# Patient Record
Sex: Male | Born: 1985
Health system: Southern US, Community
[De-identification: ages and names within clinical notes are randomized; demographics above are authoritative.]

## PROBLEM LIST (undated history)

## (undated) DIAGNOSIS — J45909 Unspecified asthma, uncomplicated: Secondary | ICD-10-CM

## (undated) HISTORY — DX: Unspecified asthma, uncomplicated: J45.909

---

## 2009-05-18 ENCOUNTER — Encounter: Admission: RE | Admit: 2009-05-18 | Discharge: 2009-05-18 | Payer: Self-pay | Admitting: Infectious Diseases

## 2011-06-09 ENCOUNTER — Inpatient Hospital Stay (INDEPENDENT_AMBULATORY_CARE_PROVIDER_SITE_OTHER)
Admission: RE | Admit: 2011-06-09 | Discharge: 2011-06-09 | Disposition: A | Discharge status: Home / Self Care | Payer: Self-pay | Source: Ambulatory Visit | Attending: Family Medicine | Admitting: Family Medicine

## 2011-06-09 DIAGNOSIS — B078 Other viral warts: Secondary | ICD-10-CM

## 2014-06-29 ENCOUNTER — Emergency Department (INDEPENDENT_AMBULATORY_CARE_PROVIDER_SITE_OTHER)
Admission: EM | Admit: 2014-06-29 | Discharge: 2014-06-29 | Disposition: A | Discharge status: Home / Self Care | Payer: 59 | Source: Home / Self Care | Attending: Family Medicine | Admitting: Family Medicine

## 2014-06-29 ENCOUNTER — Encounter (HOSPITAL_COMMUNITY): Payer: Self-pay | Admitting: Emergency Medicine

## 2014-06-29 DIAGNOSIS — J342 Deviated nasal septum: Secondary | ICD-10-CM

## 2014-06-29 DIAGNOSIS — J309 Allergic rhinitis, unspecified: Secondary | ICD-10-CM

## 2014-06-29 MED ORDER — FLUTICASONE PROPIONATE 50 MCG/ACT NA SUSP: 2.0000 | Freq: Every day | NASAL | Status: DC

## 2014-06-29 NOTE — Discharge Instructions (Signed)
Your symptoms are likely a result of a swollen nasal passage from allergies which is made worse by a slightly deviated septum on the left. Please start using nasal saline 2-4 times a day to clean everything out. Please also start using the flonase at night. This is a nasal steroid to help with the inflammation and swelling Please also consider using ibuprofen 400-600mg  from time to time if needed Please consider getting in to see a regular doctor for possible ENT evaluation.    Cc tri?u ch?ng c?a b?n c th? s? l k?t qu? c?a m?t ?o?n m?i s?ng d? ?ng m c th? t? h?n b?i m?t vch ng?n h?i l?ch v? bn tri. Hy b?t ??u b?ng cch s? d?ng n??c mu?i vo m?i 2-4 l?n m?t ngy ?? lm s?ch t?t c? m?i th? ra ngoi. Xin c?ng b?t ??u s? d?ng cc Flonase vo ban ?m. ?y l m?t steroid m?i ?? gip ?? v?i tnh tr?ng vim v s?ng Xin vui lng xem xt s? d?ng 400-600mg  ibuprofen theo th?i gian n?u c?n thi?t Vui lng xem xt nh?n ???c vo g?p bc s? th??ng xuyn ?? ?nh gi ENT c th?.Marland Kitchen

## 2014-06-29 NOTE — ED Provider Notes (Signed)
CSN: [id number]     Arrival date & time 06/29/14  [id number] History   None    Chief Complaint  Patient presents with  . Facial Pain    nasal pain   (Consider location/radiation/quality/duration/timing/severity/associated sxs/prior Treatment) HPI  Nasal sensitivity: constant. Worse [facility] cold [facility]10 in severity. starrted 2 years ago. Getting worse. Both. Denies any fractures. OTC allergy medications w/o relief. No radiation. Occasionally associated w/ sneezing, and runny nose. Denies HA, bloody nose.    History reviewed. No pertinent past medical history. History reviewed. No pertinent past surgical history. History reviewed. No pertinent family history. History  Substance Use Topics  . Smoking status: Never Smoker   . Smokeless tobacco: Not on file  . Alcohol Use: Yes    Review of Systems Per HPI with all other pertinent systems negative.   Allergies  Review of patient's allergies indicates no known allergies.  Home Medications   Prior to Admission medications   Medication Sig Start Date End Date Taking? Authorizing Provider  fluticasone (FLONASE) 50 MCG/ACT nasal spray Place 2 sprays into both nostrils daily. 06/29/14   Ozella Rocksavid J Adanya Sosinski, MD   BP 134/91  Pulse 68  Temp(Src) 97.9 F (36.6 C) (Oral)  Resp 16  SpO2 98% Physical Exam  Constitutional: He appears well-developed and well-nourished. No distress.  HENT:  Head: Normocephalic and atraumatic.  Mouth/Throat: No oropharyngeal exudate.  Nasal turbinates boggy bilat. L nasal septum deviated slightly   Eyes: EOM are normal. Pupils are equal, round, and reactive to light.  Neck: Normal range of motion.  Cardiovascular: Normal rate, normal heart sounds and intact distal pulses.  Exam reveals no gallop.   No murmur heard. Pulmonary/Chest: Effort normal and breath sounds normal. No respiratory distress. He has no wheezes. He has no rales. He exhibits no tenderness.  Abdominal: Soft. He  exhibits no distension.  Musculoskeletal: Normal range of motion. He exhibits no edema and no tenderness.  Lymphadenopathy:    He has no cervical adenopathy.  Neurological: He is alert. He exhibits normal muscle tone.  Skin: Skin is warm. No rash noted. He is not diaphoretic.  Psychiatric: He has a normal mood and affect. His behavior is normal. Judgment and thought content normal.    ED Course  Procedures (including critical care time) Labs Review Labs Reviewed - No data to display  Imaging Review No results found.   MDM   1. Deviated septum   2. Allergic rhinitis, unspecified allergic rhinitis type    Deivated septum and allergic rhinitis are the most likely cause of pts symptoms.  Start nasal saline and flonase Consider ENT f/u for possible surgical correction.  Precautions given and all questions answered   Shelly Flattenavid Negar Sieler, MD Family Medicine 06/29/2014, 9:15 AM      Ozella Rocksavid J Retina Bernardy, MD 06/29/14 [phone]

## 2014-06-29 NOTE — ED Notes (Signed)
C/o nasal pain over the past two years but has gradually gotten worse.  States has more pain with in cold conditions.  Some nose bleeds and stuffiness.  No relief with otc allergy meds.

## 2016-01-04 ENCOUNTER — Emergency Department (HOSPITAL_COMMUNITY): Payer: BLUE CROSS/BLUE SHIELD

## 2016-01-04 ENCOUNTER — Encounter (HOSPITAL_COMMUNITY): Payer: Self-pay | Admitting: *Deleted

## 2016-01-04 ENCOUNTER — Emergency Department (HOSPITAL_COMMUNITY)
Admission: EM | Admit: 2016-01-04 | Discharge: 2016-01-04 | Disposition: A | Discharge status: Home / Self Care | Payer: BLUE CROSS/BLUE SHIELD | Attending: Emergency Medicine | Admitting: Emergency Medicine

## 2016-01-04 DIAGNOSIS — J40 Bronchitis, not specified as acute or chronic: Secondary | ICD-10-CM | POA: Diagnosis not present

## 2016-01-04 DIAGNOSIS — Z7951 Long term (current) use of inhaled steroids: Secondary | ICD-10-CM | POA: Diagnosis not present

## 2016-01-04 DIAGNOSIS — R05 Cough: Secondary | ICD-10-CM | POA: Diagnosis present

## 2016-01-04 MED ORDER — IPRATROPIUM-ALBUTEROL 0.5-2.5 (3) MG/3ML IN SOLN
3.0000 mL | Freq: Once | RESPIRATORY_TRACT | Status: AC
  Administered 2016-01-04: 3 mL via RESPIRATORY_TRACT
  Filled 2016-01-04: qty 3

## 2016-01-04 MED ORDER — PREDNISONE 20 MG PO TABS
40.0000 mg | ORAL_TABLET | Freq: Every day | ORAL | Status: DC
Start: 2016-01-04 — End: 2016-02-07

## 2016-01-04 MED ORDER — GUAIFENESIN-DM 100-10 MG/5ML PO SYRP
5.0000 mL | ORAL_SOLUTION | ORAL | Status: DC | PRN
Start: 2016-01-04 — End: 2017-02-19

## 2016-01-04 MED ORDER — IPRATROPIUM BROMIDE 0.02 % IN SOLN
0.5000 mg | Freq: Once | RESPIRATORY_TRACT | Status: AC
  Administered 2016-01-04: 0.5 mg via RESPIRATORY_TRACT
  Filled 2016-01-04: qty 2.5

## 2016-01-04 MED ORDER — ALBUTEROL SULFATE HFA 108 (90 BASE) MCG/ACT IN AERS
2.0000 | INHALATION_SPRAY | Freq: Once | RESPIRATORY_TRACT | Status: AC
  Administered 2016-01-04: 2 via RESPIRATORY_TRACT
  Filled 2016-01-04: qty 6.7

## 2016-01-04 MED ORDER — PREDNISONE 20 MG PO TABS
60.0000 mg | ORAL_TABLET | Freq: Once | ORAL | Status: AC
  Administered 2016-01-04: 60 mg via ORAL
  Filled 2016-01-04: qty 3

## 2016-01-04 MED ORDER — ALBUTEROL SULFATE (2.5 MG/3ML) 0.083% IN NEBU
5.0000 mg | INHALATION_SOLUTION | Freq: Once | RESPIRATORY_TRACT | Status: AC
  Administered 2016-01-04: 5 mg via RESPIRATORY_TRACT
  Filled 2016-01-04: qty 6

## 2016-01-04 NOTE — Discharge Instructions (Signed)
History inhaler, 2 puffs every 4 hours. Take prednisone as prescribed until all gone. Take Robitussin as needed for cough. Rest. Tylenol or Motrin for pain and headache. Follow-up as needed. Return if worsening symptoms. Stop smoking  Acute Bronchitis Bronchitis is inflammation of the airways that extend from the windpipe into the lungs (bronchi). The inflammation often causes mucus to develop. This leads to a cough, which is the most common symptom of bronchitis.  In acute bronchitis, the condition usually develops suddenly and goes away over time, usually in a couple weeks. Smoking, allergies, and asthma can make bronchitis worse. Repeated episodes of bronchitis may cause further lung problems.  CAUSES Acute bronchitis is most often caused by the same virus that causes a cold. The virus can spread from person to person (contagious) through coughing, sneezing, and touching contaminated objects. SIGNS AND SYMPTOMS   Cough.   Fever.   Coughing up mucus.   Body aches.   Chest congestion.   Chills.   Shortness of breath.   Sore throat.  DIAGNOSIS  Acute bronchitis is usually diagnosed through a physical exam. Your health care provider will also ask you questions about your medical history. Tests, such as chest X-rays, are sometimes done to rule out other conditions.  TREATMENT  Acute bronchitis usually goes away in a couple weeks. Oftentimes, no medical treatment is necessary. Medicines are sometimes given for relief of fever or cough. Antibiotic medicines are usually not needed but may be prescribed in certain situations. In some cases, an inhaler may be recommended to help reduce shortness of breath and control the cough. A cool mist vaporizer may also be used to help thin bronchial secretions and make it easier to clear the chest.  HOME CARE INSTRUCTIONS  Get plenty of rest.   Drink enough fluids to keep your urine clear or pale yellow (unless you have a medical condition  that requires fluid restriction). Increasing fluids may help thin your respiratory secretions (sputum) and reduce chest congestion, and it will prevent dehydration.   Take medicines only as directed by your health care provider.  If you were prescribed an antibiotic medicine, finish it all even if you start to feel better.  Avoid smoking and secondhand smoke. Exposure to cigarette smoke or irritating chemicals will make bronchitis worse. If you are a smoker, consider using nicotine gum or skin patches to help control withdrawal symptoms. Quitting smoking will help your lungs heal faster.   Reduce the chances of another bout of acute bronchitis by washing your hands frequently, avoiding people with cold symptoms, and trying not to touch your hands to your mouth, nose, or eyes.   Keep all follow-up visits as directed by your health care provider.  SEEK MEDICAL CARE IF: Your symptoms do not improve after 1 week of treatment.  SEEK IMMEDIATE MEDICAL CARE IF:  You develop an increased fever or chills.   You have chest pain.   You have severe shortness of breath.  You have bloody sputum.   You develop dehydration.  You faint or repeatedly feel like you are going to pass out.  You develop repeated vomiting.  You develop a severe headache. MAKE SURE YOU:   Understand these instructions.  Will watch your condition.  Will get help right away if you are not doing well or get worse.   This information is not intended to replace advice given to you by your health care provider. Make sure you discuss any questions you have with your health care  provider.   Document Released: 01/01/2005 Document Revised: 12/15/2014 Document Reviewed: 05/17/2013 Elsevier Interactive Patient Education [employer].

## 2016-01-04 NOTE — ED Provider Notes (Signed)
CSN: [id number]     Arrival date & time 01/04/16  1931 History  By signing my name below, I, Timothy Adkins, attest that this documentation has been prepared under the direction and in the presence of Ramello Cordial, PA-C. Electronically Signed: Placido Adkins, ED Scribe. 01/04/2016. 9:21 PM.    Chief Complaint  Patient presents with  . Cough   The history is provided by the patient and a friend. No language interpreter was used.    HPI Comments: Timothy Adkins is a 30 y.o. male who presents to the Emergency Department complaining of worsening, moderate, productive cough with onset 4 days ago. He friend states he's experienced associated back pain, SOB, wheezing and HA. Pt was taking OTC cold medication which he ran out of earlier today with his symptoms then worsening with his friend noting that he called him this afternoon due to his worsening SOB. He denies a hx of asthma but confirms a hx of smoking. His vaccinations are UTD. He denies any other associated symptoms at this time.    History reviewed. No pertinent past medical history. History reviewed. No pertinent past surgical history. No family history on file. Social History  Substance Use Topics  . Smoking status: Never Smoker   . Smokeless tobacco: None  . Alcohol Use: Yes    Review of Systems  Respiratory: Positive for cough, shortness of breath and wheezing.   Musculoskeletal: Positive for back pain.  Neurological: Positive for headaches.    Allergies  Review of patient's allergies indicates no known allergies.  Home Medications   Prior to Admission medications   Medication Sig Start Date End Date Taking? Authorizing Provider  fluticasone (FLONASE) 50 MCG/ACT nasal spray Place 2 sprays into both nostrils daily. 06/29/14   Ozella Rocks, MD   BP 119/82 mmHg  Pulse 82  Temp(Src) 98.8 F (37.1 C)  Resp 20  Wt 142 lb (64.411 kg)  SpO2 98%    Physical Exam  Constitutional: He is oriented to person, place,  and time. He appears well-developed and well-nourished.  HENT:  Head: Normocephalic and atraumatic.  Eyes: EOM are normal.  Neck: Normal range of motion.  Cardiovascular: Normal rate, regular rhythm and normal heart sounds.   Pulmonary/Chest: Effort normal. No respiratory distress. He has wheezes. He has rales.  Expiratory wheezes and rales at bases  Abdominal: Soft.  Musculoskeletal: Normal range of motion.  Neurological: He is alert and oriented to person, place, and time.  Skin: Skin is warm and dry.  Psychiatric: He has a normal mood and affect.  Nursing note and vitals reviewed.   ED Course  Procedures  DIAGNOSTIC STUDIES: Oxygen Saturation is 98% on RA, normal by my interpretation.    COORDINATION OF CARE: 9:20 PM Discussed next steps with pt and his friend. They understood and are agreeable to the plan.   Labs Review Labs Reviewed - No data to display  Imaging Review Dg Chest 2 View  01/04/2016  CLINICAL DATA:  Initial evaluation for acute chest pain with cough and congestion. Fever. Smoker. EXAM: CHEST  2 VIEW COMPARISON:  Prior radiograph from 05/18/2009. FINDINGS: Cardiac and mediastinal silhouettes are stable in size and contour, and remain within normal limits. Lungs are mildly hypoinflated. No pulmonary edema or pleural effusion. There is diffuse peribronchial thickening, suggesting bronchiolitis. Finding may in part be related to history of smoking. No pneumothorax. No acute osseus abnormality. IMPRESSION: Diffuse peribronchial thickening, suggesting acute bronchiolitis given the history of cough, congestion and fever. No  consolidative opacity to suggest pneumonia. Electronically Signed   By: Rise Mu M.D.   On: 01/04/2016 21:05   I have personally reviewed and evaluated these images as part of my medical decision-making.   EKG Interpretation None      MDM   Final diagnoses:  Bronchitis   Pt with cough for the last 3 days, wheezing, rales heard at  bases bilaterally. X-ray obtained which is consistent bronchiolitis. Patient is a smoker. Patient received 2 breathing treatments which he states helped. Also start on prednisone, discharged home with inhaler, will try Robitussin DM for cough. Follow-up with a family doctor. This time vital signs are normal, patient is not hypoxic, afebrile. No concerning findings on chest x-ray.  Filed Vitals:   01/04/16 1949  BP: 119/82  Pulse: 82  Temp: 98.8 F (37.1 C)  Resp: 20  Weight: 64.411 kg  SpO2: 98%   I personally performed the services described in this documentation, which was scribed in my presence. The recorded information has been reviewed and is accurate.   Jaynie Crumble, PA-C 01/04/16 2202  Richardean Canal, MD 01/04/16 [phone]

## 2016-01-04 NOTE — ED Notes (Signed)
Cough cold with headache back pain and some diff breathing.  Productive cough  Think watery  Audible wheezes

## 2016-02-07 ENCOUNTER — Ambulatory Visit (INDEPENDENT_AMBULATORY_CARE_PROVIDER_SITE_OTHER): Payer: BLUE CROSS/BLUE SHIELD | Admitting: Family Medicine

## 2016-02-07 VITALS — BP 110/80 | HR 90 | Temp 98.2°F | Resp 18 | Ht 63.0 in | Wt 143.8 lb

## 2016-02-07 DIAGNOSIS — J4521 Mild intermittent asthma with (acute) exacerbation: Secondary | ICD-10-CM

## 2016-02-07 MED ORDER — ALBUTEROL SULFATE HFA 108 (90 BASE) MCG/ACT IN AERS: 2.0000 | INHALATION_SPRAY | RESPIRATORY_TRACT | Status: DC | PRN

## 2016-02-07 MED ORDER — PREDNISONE 20 MG PO TABS: ORAL_TABLET | ORAL | Status: DC

## 2016-02-07 MED ORDER — ALBUTEROL SULFATE (2.5 MG/3ML) 0.083% IN NEBU
2.5000 mg | INHALATION_SOLUTION | Freq: Once | RESPIRATORY_TRACT | Status: AC
  Administered 2016-02-07: 2.5 mg via RESPIRATORY_TRACT

## 2016-02-07 MED ORDER — BECLOMETHASONE DIPROPIONATE 40 MCG/ACT IN AERS: 2.0000 | INHALATION_SPRAY | Freq: Two times a day (BID) | RESPIRATORY_TRACT | Status: DC

## 2016-02-07 NOTE — Patient Instructions (Signed)
You have asthma which is probably caused by allergies. We have an early spring and that is causing a lot of pollen in the air. You will probably have problems with this off and on until summertime. I also expect that you may have problems every year in the spring time.  Use the albuterol (Proventil) inhaler 2 inhalations every 4-6 hours, a proximally 4 times daily. This should be used as I instructed you in the office. This is not to be used long-term if you're not needing it. Keep some of the medicine on hand for any time that you do develop wheezing and coughing.  Use the Qvar inhaler also, 40 g,, 2 inhalations twice daily. If your breathing has been doing well for 3 or 4 weeks you can then decrease to using one inhalation twice daily. This medicine should be used long-term to help prevent asthma attacks. If you're doing well by about July you can discontinue this and see how you do.  If you're not doing better on these 2 medicines please return because there are some other medications we can try.  Asthma, Adult Asthma is a recurring condition in which the airways tighten and narrow. Asthma can make it difficult to breathe. It can cause coughing, wheezing, and shortness of breath. Asthma episodes, also called asthma attacks, range from minor to life-threatening. Asthma cannot be cured, but medicines and lifestyle changes can help control it. CAUSES Asthma is believed to be caused by inherited (genetic) and environmental factors, but its exact cause is unknown. Asthma may be triggered by allergens, lung infections, or irritants in the air. Asthma triggers are different for each person. Common triggers include:   Animal dander.  Dust mites.  Cockroaches.  Pollen from trees or grass.  Mold.  Smoke.  Air pollutants such as dust, household cleaners, hair sprays, aerosol sprays, paint fumes, strong chemicals, or strong odors.  Cold air, weather changes, and winds (which increase molds and  pollens in the air).  Strong emotional expressions such as crying or laughing hard.  Stress.  Certain medicines (such as aspirin) or types of drugs (such as beta-blockers).  Sulfites in foods and drinks. Foods and drinks that may contain sulfites include dried fruit, potato chips, and sparkling grape juice.  Infections or inflammatory conditions such as the flu, a cold, or an inflammation of the nasal membranes (rhinitis).  Gastroesophageal reflux disease (GERD).  Exercise or strenuous activity. SYMPTOMS Symptoms may occur immediately after asthma is triggered or many hours later. Symptoms include:  Wheezing.  Excessive nighttime or early morning coughing.  Frequent or severe coughing with a common cold.  Chest tightness.  Shortness of breath. DIAGNOSIS  The diagnosis of asthma is made by a review of your medical history and a physical exam. Tests may also be performed. These may include:  Lung function studies. These tests show how much air you breathe in and out.  Allergy tests.  Imaging tests such as X-rays. TREATMENT  Asthma cannot be cured, but it can usually be controlled. Treatment involves identifying and avoiding your asthma triggers. It also involves medicines. There are 2 classes of medicine used for asthma treatment:   Controller medicines. These prevent asthma symptoms from occurring. They are usually taken every day.  Reliever or rescue medicines. These quickly relieve asthma symptoms. They are used as needed and provide short-term relief. Your health care provider will help you create an asthma action plan. An asthma action plan is a written plan for managing and treating  your asthma attacks. It includes a list of your asthma triggers and how they may be avoided. It also includes information on when medicines should be taken and when their dosage should be changed. An action plan may also involve the use of a device called a peak flow meter. A peak flow meter  measures how well the lungs are working. It helps you monitor your condition. HOME CARE INSTRUCTIONS   Take medicines only as directed by your health care provider. Speak with your health care provider if you have questions about how or when to take the medicines.  Use a peak flow meter as directed by your health care provider. Record and keep track of readings.  Understand and use the action plan to help minimize or stop an asthma attack without needing to seek medical care.  Control your home environment in the following ways to help prevent asthma attacks:  Do not smoke. Avoid being exposed to secondhand smoke.  Change your heating and air conditioning filter regularly.  Limit your use of fireplaces and wood stoves.  Get rid of pests (such as roaches and mice) and their droppings.  Throw away plants if you see mold on them.  Clean your floors and dust regularly. Use unscented cleaning products.  Try to have someone else vacuum for you regularly. Stay out of rooms while they are being vacuumed and for a short while afterward. If you vacuum, use a dust mask from a hardware store, a double-layered or microfilter vacuum cleaner bag, or a vacuum cleaner with a HEPA filter.  Replace carpet with wood, tile, or vinyl flooring. Carpet can trap dander and dust.  Use allergy-proof pillows, mattress covers, and box spring covers.  Wash bed sheets and blankets every week in hot water and dry them in a dryer.  Use blankets that are made of polyester or cotton.  Clean bathrooms and kitchens with bleach. If possible, have someone repaint the walls in these rooms with mold-resistant paint. Keep out of the rooms that are being cleaned and painted.  Wash hands frequently. SEEK MEDICAL CARE IF:   You have wheezing, shortness of breath, or a cough even if taking medicine to prevent attacks.  The colored mucus you cough up (sputum) is thicker than usual.  Your sputum changes from clear or  white to yellow, green, gray, or bloody.  You have any problems that may be related to the medicines you are taking (such as a rash, itching, swelling, or trouble breathing).  You are using a reliever medicine more than 2-3 times per week.  Your peak flow is still at 50-79% of your personal best after following your action plan for 1 hour.  You have a fever. SEEK IMMEDIATE MEDICAL CARE IF:   You seem to be getting worse and are unresponsive to treatment during an asthma attack.  You are short of breath even at rest.  You get short of breath when doing very little physical activity.  You have difficulty eating, drinking, or talking due to asthma symptoms.  You develop chest pain.  You develop a fast heartbeat.  You have a bluish color to your lips or fingernails.  You are light-headed, dizzy, or faint.  Your peak flow is less than 50% of your personal best.   This information is not intended to replace advice given to you by your health care provider. Make sure you discuss any questions you have with your health care provider.   Document Released: 11/24/2005 Document Revised:  08/15/2015 Document Reviewed: 06/23/2013 Elsevier Interactive Patient Education 2016 [employer].  Hen suy?n, Ng??i l?n (Asthma, Adult) Hen suy?n l m?t tnh tr?ng b?nh l ti pht trong ?, ???ng h h?p b? th?t ch?t v h?p l?i. Hen suy?n c th? gy kh th?. N c th? gy ho, th? kh kh, v kh th? Cc ??t hen, hay cn g?i l cc c?n hen, c th? ? m?c ?? t? nh? ??n nguy hi?m ??n tnh m?ng. Khng th? ch?a kh?i ???c hen suy?n, nh?ng thu?c v thay ??i l?i s?ng c th? gip ki?m sot b?nh ny. NGUYN NHN Hen ???c cho l do cc y?u t? ???c th?a h??ng t? th? h? tr??c (di truy?n) v mi tr??ng, nh?ng nguyn nhn chnh xc v?n ch?a ???c xc ??nh. Hen c th? b? kh?i pht b?i cc d? ?ng nguyn, nhi?m trng ph?i, ho?c cc ch?t kch thch trong khng kh. Tc nhn gy hen suy?n khc nhau v?i m?i ng??i. Tc nhn ph?  bi?n gy kh?i pht b?nh bao g?m:   V?y da ??ng v?t.  M?t b?i nh.  Gin.  Ph?n hoa c?a cy ho?c c?.  N?m m?c.  Khi thu?c.  Cc ch?t  nhi?m khng kh nh? b?i, ch?t t?y r?a ?? gia d?ng, ch?t x?t tc, ch?t phun x?t, h?i s?n, ha ch?t m?nh ho?c mi m?nh.  Khng kh l?nh, thay ??i th?i ti?t, v gi (lm t?ng l??ng n?m m?c v ph?n hoa trong khng kh).  Bi?u l? c?m xc m?nh nh? khc ho?c c??i nhi?u.  C?ng th?ng.  M?t s? lo?i thu?c nh?t ??nh (nh? aspirin) ho?c cc lo?i thu?c (nh? thu?c ch?n b ta).  Ch?t sunfit trong th?c ?n v ?? u?ng. Th?c ?n v ?? u?ng c th? c ch?t sunfit, bao g?m tri cy kh, khoai ty chin, v n??c nho c ga.  Cc tnh tr?ng nhi?m trng ho?c vim nh? cm, c?m l?nh ho?c vim nim m?c m?i (vim m?i ).  B?nh tro ng??c d? dy th?c qu?n (GERD).  T?p luy?n ho?c ho?t ??ng c?ng th?ng. TRI?U CH?NG Cc tri?u ch?ng c th? di?n ra ngay sau khi hen suy?n kh?i pht ho?c nhi?u gi? sau ?. Cc tri?u ch?ng bao g?m:  Th? kh kh.  Ho nhi?u vo ban ?m ho?c bu?i sng s?m.  Ho th??ng xuyn ho?c ho d? d?i khi b? c?m l?nh thng th??ng.  T?c ng?c.  Kh th?. CH?N ?ON  Ch?n ?on hen suy?n ???c th?c hi?n b?ng cch xem xt b?nh s? c?a qu v? v khm th?c th?. C?ng c th? ph?i lm cc ph?n ki?m tra. Nh?ng ki?m tra ny c th? bao g?m:  Nghin c?u ch?ng n?ng ph?i. Ki?m tra ny cho bi?t qu v? ht vo v th? ra ???c bao nhiu khng kh.  Ki?m tra d? ?ng.  Ki?m tra b?ng hnh ?nh nh? ch?p X quang. ?I?U TR?  Hen suy?n khng th? ch?a kh?i ???c, nh?ng th??ng c th? ki?m sot ???c. ?i?u tr? bao g?m vi?c xc ??nh v phng trnh cc tc nhn gy kh?i pht hen suy?n cho qu v?. ?i?u tr? c?ng c th? bao g?m c? thu?c. C 2 lo?i thu?c ???c s? d?ng ?? ?i?u tr? hen suy?n:   Cc thu?c ki?m sot. Cc thu?c ny ng?n ng?a khng cho cc tri?u ch?ng hen suy?n x?y ra. Thu?c th??ng ???c dng m?i ngy.  Thu?c lm gi?m nh? ho?c c?p c?u. Nh?ng thu?c ny nhanh chng lm gi?m cc tri?u ch?ng hen  suy?n. Cc thu?c ? th??ng ???  c dng khi c?n thi?t v ?? gi?m nh? tri?u ch?ng trong th?i gian ng?n. Chuyn gia ch?m [place] s?c kh?e s? gip qu v? xy d?ng m?t k? ho?ch th?c hi?n vi?c ki?m sot hen suy?n. M?t k? ho?ch th?c hi?n vi?c ki?m sot hen suy?n l m?t k? ho?ch ???c ??a ra ?? qu?n l v ?i?u tr? cc c?n hen suy?n c?a qu v?. N bao g?m m?t danh sch cc tc nhn gy kh?i pht hen suy?n v cch c th? phng trnh [place]. K? ho?ch ? c?ng bao g?m thng tin v? vi?c khi no c?n dng thu?c v khi no c?n thay ??i li?u thu?c. M?t k? ho?ch th?c hi?n c?ng c th? bao g?m vi?c s? d?ng m?t d?ng c? g?i l l?u l??ng ??nh k?. L?u l??ng ??nh k? ?o kh? n?ng ho?t ??ng c?a ph?i. N gip gim st tnh tr?ng c?a qu v?. H??NG D?N CH?M [place] T?I NH   Ch? s? d?ng thu?c theo ch? d?n c?a chuyn gia ch?m [place] s?c kh?e. Hy ni v?i chuyn gia ch?m [place] s?c kh?e n?u qu v? c cc cu h?i v? cch th?c v th?i gian dng thu?c.  S? d?ng l?u l??ng ??nh k? theo ch? d?n c?a chuyn gia ch?m [place] s?c kh?e. Ghi l?i v theo di cc k?t qu? ?o.  Hi?u v s? d?ng k? ho?ch th?c hi?n ?? gip gi?m thi?u ho?c ng?n ch?n c?n hen m khng c?n ph?i ?i khm.  Ki?m sot mi tr??ng trong nh c?a qu v? theo nh?ng cch sau ?? gip ng?n ch?n c?n hen:  Khng ht thu?c. [place] b? ti?p xc v?i khi thu?c th? ??ng.  Thay b? l?c l s??i v ?i?u ho khng kh th??ng xuyn.  H?n ch? s? d?ng l s??i v b?p c?i.  Di?t v?t gy h?i (ch?ng h?n nh? gin v chu?t) v lo?i b? phn c?a chng.  V?t b? cy n?u qu v? th?y chng b? n?m m?c.  Th??ng xuyn lm s?ch sn nh v b?i b?n. S? d?ng s?n ph?m lm s?ch khng mi.  C? g?ng nh? m?t ng??i khc th??ng xuyn ht b?i cho qu v?. Khng vo cc phng trong lc ?ang ht b?i v m?t lc sau ?Marland Kitchen N?u qu v? ht b?i, hy s? d?ng m?t m?t n? ch?ng b?i mua ? c?a hng ?? dng trong nh, m?t ti ch?a b?i hai l?p ho?c ti c b? l?c trong my ht b?i, ho?c m?t my ht b?i c b? l?c HEPA.  Thay th? th?m b?ng sn g?, ? lt ho?c  nh?a vinyl. Th?m c th? dnh lng v b?i.  S? d?ng g?i, ga tr?i gi??ng v ga tr?i n?m l xo ch?ng d? ?ng.  Gi?t kh?n tr?i gi??ng v ch?n hng tu?n b?ng n??c nng v dng my s?y s?y kh.  S? d?ng ch?n lm b?ng polyester ho?c v?i bng.  Lm s?ch nh v? sinh v b?p b?ng ch?t t?y. N?u c th?, hy s?n l?i t??ng trong cc phng ny b?ng lo?i s?n ch?ng m?c. Trnh xa cc phng ?ang ???c lm s?ch v ?ang ???c s?n.  R?a tay th??ng xuyn. ?I [facility] N?U:   Qu v? th? kh kh, kh th? ho?c ho ngay c? khi ? dng thu?c ?? ng?n ch?n c?n hen.  Ch?t nhy c mu m qu v? ho ra (??m) ??c h?n bnh th??ng.  ??m c?a qu v? thay ??i t? khng mu (trong) ho?c mu tr?ng sang mu vng, xanh l cy, xm ho?c c mu.  Qu v?  c b?t k? v?n ?? no lin quan ??n thu?c ?ang s? d?ng (ch?ng h?n nh? pht ban, ng?a, s?ng ho?c kh th?).  Qu v? s? d?ng thu?c lm gi?m hen suy?n nhi?u h?n 2-3 l?n m?i tu?n.  L?u l??ng ??nh c?a qu v? v?n ? kho?ng 50-79% ch? s? c nhn t?t nh?t sau khi lm theo k? ho?ch th?c hi?n c?a qu v? trong 1 ti?ng.  Qu v? b? s?t. NGAY L?P T?C ?I [facility] N?U:   Qu v? d??ng nh? b? n?ng h?n v khng ?p ?ng v?i vi?c ?i?u tr? trong c?n hen.  Qu v? kh th? ngay c? khi ngh?[place] v? kh th? khi th?c hi?n ho?t ??ng th? ch?t r?t nh?L[place]ell Heads v? g?p kh kh?n khi ?n, u?ng ho?c ni chuy?n do cc tri?u ch?ng hen suy?n.  Qu v? b? ?au ng?c.  Qu v? c nh?p tim nhanh.  Mi ho?c mng tay c?a qu v? c mu xanh nh?t.  Qu v? b? chong vng, chng m?t ho?c ng?t.  L?u l??ng ??nh c?a qu v? d??i 50% ch? s? c nhn t?t nh?t.   Thng tin ny khng nh?m m?c ?ch thay th? cho l?i khuyn m chuyn gia ch?m [place] s?c kh?e ni v?i qu v?. Hy b?o ??m qu v? ph?i th?o lu?n b?t k? v?n ?? g m qu v? c v?i chuyn gia ch?m [place] s?c kh?e c?a qu v?.   Document Released: 11/24/2005 Document Revised: 08/15/2015 Elsevier Interactive Patient Education [employer]

## 2016-02-07 NOTE — Progress Notes (Signed)
Patient ID: Timothy Adkins, male    DOB: 1986-06-16  Age: 30 y.o. MRN: [id number]  Chief Complaint  Patient presents with  . Cough    Subjective:   30 year old [place] male who is been in the [place] for 8 years. He does not have any history of allergies. He was in the emergency room 5 weeks ago with what was diagnosed as acute bronchitis and he was given a dose of prednisone at that time. He has done well until last night and he acutely developed bad coughing all night it kept him awake. He is short of breath. He does have nasal congestion. He has some aching in his back but no generalized body aches. He has not had fever. He does not have history of the flu. He has no history of respiratory allergies, but the pollens just started coming a lot. He used to smoke but he quit a month ago.  Current allergies, medications, problem list, past/family and social histories reviewed.  Objective:  BP 110/80 mmHg  Pulse 90  Temp(Src) 98.2 F (36.8 C) (Oral)  Resp 18  Ht 5\' 3"  (1.6 m)  Wt 143 lb 12.8 oz (65.227 kg)  BMI 25.48 kg/m2  SpO2 96%  TMs normal. Throat clear. Neck supple without nodes. Chest is moderately tight wheezing throughout his lungs. He is splinting a little bit to breathe. Not cyanotic.  Assessment & Plan:   Assessment: 1. Asthma with acute exacerbation, mild intermittent       Plan: Acute asthma for which we will treat initially with albuterol.  No orders of the defined types were placed in this encounter.    Meds ordered this encounter  Medications  . albuterol (PROVENTIL) (2.5 MG/3ML) 0.083% nebulizer solution 2.5 mg    Sig:   . predniSONE (DELTASONE) 20 MG tablet    Sig: Take 3 pills daily for 2 days, then 2 daily for 2 days, then 1 daily for 2 days for asthma    Dispense:  12 tablet    Refill:  0  . albuterol (PROVENTIL HFA;VENTOLIN HFA) 108 (90 Base) MCG/ACT inhaler    Sig: Inhale 2 puffs into the lungs every 4 (four) hours as needed for wheezing or  shortness of breath (cough, shortness of breath or wheezing.).    Dispense:  1 Inhaler    Refill:  2  . beclomethasone (QVAR) 40 MCG/ACT inhaler    Sig: Inhale 2 puffs into the lungs 2 (two) times daily.    Dispense:  1 Inhaler    Refill:  12    Discussed asthma with patient, and instructed him in use of the inhalers. He is to use the albuterol regularly while he is having the attack. Then he can decrease to using it on a when necessary basis. He is to keep using the Qvar inhaler until at least July to try and prevent further attacks.     Patient Instructions  You have asthma which is probably caused by allergies. We have an early spring and that is causing a lot of pollen in the air. You will probably have problems with this off and on until summertime. I also expect that you may have problems every year in the spring time.  Use the albuterol (Proventil) inhaler 2 inhalations every 4-6 hours, a proximally 4 times daily. This should be used as I instructed you in the office. This is not to be used long-term if you're not needing it. Keep some of the medicine on hand  for any time that you do develop wheezing and coughing.  Use the Qvar inhaler also, 40 g,, 2 inhalations twice daily. If your breathing has been doing well for 3 or 4 weeks you can then decrease to using one inhalation twice daily. This medicine should be used long-term to help prevent asthma attacks. If you're doing well by about July you can discontinue this and see how you do.  If you're not doing better on these 2 medicines please return because there are some other medications we can try.  Asthma, Adult Asthma is a recurring condition in which the airways tighten and narrow. Asthma can make it difficult to breathe. It can cause coughing, wheezing, and shortness of breath. Asthma episodes, also called asthma attacks, range from minor to life-threatening. Asthma cannot be cured, but medicines and lifestyle changes can help  control it. CAUSES Asthma is believed to be caused by inherited (genetic) and environmental factors, but its exact cause is unknown. Asthma may be triggered by allergens, lung infections, or irritants in the air. Asthma triggers are different for each person. Common triggers include:   Animal dander.  Dust mites.  Cockroaches.  Pollen from trees or grass.  Mold.  Smoke.  Air pollutants such as dust, household cleaners, hair sprays, aerosol sprays, paint fumes, strong chemicals, or strong odors.  Cold air, weather changes, and winds (which increase molds and pollens in the air).  Strong emotional expressions such as crying or laughing hard.  Stress.  Certain medicines (such as aspirin) or types of drugs (such as beta-blockers).  Sulfites in foods and drinks. Foods and drinks that may contain sulfites include dried fruit, potato chips, and sparkling grape juice.  Infections or inflammatory conditions such as the flu, a cold, or an inflammation of the nasal membranes (rhinitis).  Gastroesophageal reflux disease (GERD).  Exercise or strenuous activity. SYMPTOMS Symptoms may occur immediately after asthma is triggered or many hours later. Symptoms include:  Wheezing.  Excessive nighttime or early morning coughing.  Frequent or severe coughing with a common cold.  Chest tightness.  Shortness of breath. DIAGNOSIS  The diagnosis of asthma is made by a review of your medical history and a physical exam. Tests may also be performed. These may include:  Lung function studies. These tests show how much air you breathe in and out.  Allergy tests.  Imaging tests such as X-rays. TREATMENT  Asthma cannot be cured, but it can usually be controlled. Treatment involves identifying and avoiding your asthma triggers. It also involves medicines. There are 2 classes of medicine used for asthma treatment:   Controller medicines. These prevent asthma symptoms from occurring. They are  usually taken every day.  Reliever or rescue medicines. These quickly relieve asthma symptoms. They are used as needed and provide short-term relief. Your health care provider will help you create an asthma action plan. An asthma action plan is a written plan for managing and treating your asthma attacks. It includes a list of your asthma triggers and how they may be avoided. It also includes information on when medicines should be taken and when their dosage should be changed. An action plan may also involve the use of a device called a peak flow meter. A peak flow meter measures how well the lungs are working. It helps you monitor your condition. HOME CARE INSTRUCTIONS   Take medicines only as directed by your health care provider. Speak with your health care provider if you have questions about how or when  to take the medicines.  Use a peak flow meter as directed by your health care provider. Record and keep track of readings.  Understand and use the action plan to help minimize or stop an asthma attack without needing to seek medical care.  Control your home environment in the following ways to help prevent asthma attacks:  Do not smoke. Avoid being exposed to secondhand smoke.  Change your heating and air conditioning filter regularly.  Limit your use of fireplaces and wood stoves.  Get rid of pests (such as roaches and mice) and their droppings.  Throw away plants if you see mold on them.  Clean your floors and dust regularly. Use unscented cleaning products.  Try to have someone else vacuum for you regularly. Stay out of rooms while they are being vacuumed and for a short while afterward. If you vacuum, use a dust mask from a hardware store, a double-layered or microfilter vacuum cleaner bag, or a vacuum cleaner with a HEPA filter.  Replace carpet with wood, tile, or vinyl flooring. Carpet can trap dander and dust.  Use allergy-proof pillows, mattress covers, and box spring  covers.  Wash bed sheets and blankets every week in hot water and dry them in a dryer.  Use blankets that are made of polyester or cotton.  Clean bathrooms and kitchens with bleach. If possible, have someone repaint the walls in these rooms with mold-resistant paint. Keep out of the rooms that are being cleaned and painted.  Wash hands frequently. SEEK MEDICAL CARE IF:   You have wheezing, shortness of breath, or a cough even if taking medicine to prevent attacks.  The colored mucus you cough up (sputum) is thicker than usual.  Your sputum changes from clear or white to yellow, green, gray, or bloody.  You have any problems that may be related to the medicines you are taking (such as a rash, itching, swelling, or trouble breathing).  You are using a reliever medicine more than 2-3 times per week.  Your peak flow is still at 50-79% of your personal best after following your action plan for 1 hour.  You have a fever. SEEK IMMEDIATE MEDICAL CARE IF:   You seem to be getting worse and are unresponsive to treatment during an asthma attack.  You are short of breath even at rest.  You get short of breath when doing very little physical activity.  You have difficulty eating, drinking, or talking due to asthma symptoms.  You develop chest pain.  You develop a fast heartbeat.  You have a bluish color to your lips or fingernails.  You are light-headed, dizzy, or faint.  Your peak flow is less than 50% of your personal best.   This information is not intended to replace advice given to you by your health care provider. Make sure you discuss any questions you have with your health care provider.   Document Released: 11/24/2005 Document Revised: 08/15/2015 Document Reviewed: 06/23/2013 Elsevier Interactive Patient Education 2016 [employer].  Hen suy?n, Ng??i l?n (Asthma, Adult) Hen suy?n l m?t tnh tr?ng b?nh l ti pht trong ?, ???ng h h?p b? th?t ch?t v h?p l?i. Hen  suy?n c th? gy kh th?. N c th? gy ho, th? kh kh, v kh th? Cc ??t hen, hay cn g?i l cc c?n hen, c th? ? m?c ?? t? nh? ??n nguy hi?m ??n tnh m?ng. Khng th? ch?a kh?i ???c hen suy?n, nh?ng thu?c v thay ??i l?i s?ng c th?  gip ki?m sot b?nh ny. NGUYN NHN Hen ???c cho l do cc y?u t? ???c th?a h??ng t? th? h? tr??c (di truy?n) v mi tr??ng, nh?ng nguyn nhn chnh xc v?n ch?a ???c xc ??nh. Hen c th? b? kh?i pht b?i cc d? ?ng nguyn, nhi?m trng ph?i, ho?c cc ch?t kch thch trong khng kh. Tc nhn gy hen suy?n khc nhau v?i m?i ng??i. Tc nhn ph? bi?n gy kh?i pht b?nh bao g?m:   V?y da ??ng v?t.  M?t b?i nh.  Gin.  Ph?n hoa c?a cy ho?c c?.  N?m m?c.  Khi thu?c.  Cc ch?t  nhi?m khng kh nh? b?i, ch?t t?y r?a ?? gia d?ng, ch?t x?t tc, ch?t phun x?t, h?i s?n, ha ch?t m?nh ho?c mi m?nh.  Khng kh l?nh, thay ??i th?i ti?t, v gi (lm t?ng l??ng n?m m?c v ph?n hoa trong khng kh).  Bi?u l? c?m xc m?nh nh? khc ho?c c??i nhi?u.  C?ng th?ng.  M?t s? lo?i thu?c nh?t ??nh (nh? aspirin) ho?c cc lo?i thu?c (nh? thu?c ch?n b ta).  Ch?t sunfit trong th?c ?n v ?? u?ng. Th?c ?n v ?? u?ng c th? c ch?t sunfit, bao g?m tri cy kh, khoai ty chin, v n??c nho c ga.  Cc tnh tr?ng nhi?m trng ho?c vim nh? cm, c?m l?nh ho?c vim nim m?c m?i (vim m?i ).  B?nh tro ng??c d? dy th?c qu?n (GERD).  T?p luy?n ho?c ho?t ??ng c?ng th?ng. TRI?U CH?NG Cc tri?u ch?ng c th? di?n ra ngay sau khi hen suy?n kh?i pht ho?c nhi?u gi? sau ?. Cc tri?u ch?ng bao g?m:  Th? kh kh.  Ho nhi?u vo ban ?m ho?c bu?i sng s?m.  Ho th??ng xuyn ho?c ho d? d?i khi b? c?m l?nh thng th??ng.  T?c ng?c.  Kh th?. CH?N ?ON  Ch?n ?on hen suy?n ???c th?c hi?n b?ng cch xem xt b?nh s? c?a qu v? v khm th?c th?. C?ng c th? ph?i lm cc ph?n ki?m tra. Nh?ng ki?m tra ny c th? bao g?m:  Nghin c?u ch?ng n?ng ph?i. Ki?m tra ny cho bi?t qu v? ht vo v  th? ra ???c bao nhiu khng kh.  Ki?m tra d? ?ng.  Ki?m tra b?ng hnh ?nh nh? ch?p X quang. ?I?U TR?  Hen suy?n khng th? ch?a kh?i ???c, nh?ng th??ng c th? ki?m sot ???c. ?i?u tr? bao g?m vi?c xc ??nh v phng trnh cc tc nhn gy kh?i pht hen suy?n cho qu v?. ?i?u tr? c?ng c th? bao g?m c? thu?c. C 2 lo?i thu?c ???c s? d?ng ?? ?i?u tr? hen suy?n:   Cc thu?c ki?m sot. Cc thu?c ny ng?n ng?a khng cho cc tri?u ch?ng hen suy?n x?y ra. Thu?c th??ng ???c dng m?i ngy.  Thu?c lm gi?m nh? ho?c c?p c?u. Nh?ng thu?c ny nhanh chng lm gi?m cc tri?u ch?ng hen suy?n. Cc thu?c ? th??ng ???c dng khi c?n thi?t v ?? gi?m nh? tri?u ch?ng trong th?i gian ng?n. Chuyn gia ch?m [place] s?c kh?e s? gip qu v? xy d?ng m?t k? ho?ch th?c hi?n vi?c ki?m sot hen suy?n. M?t k? ho?ch th?c hi?n vi?c ki?m sot hen suy?n l m?t k? ho?ch ???c ??a ra ?? qu?n l v ?i?u tr? cc c?n hen suy?n c?a qu v?. N bao g?m m?t danh sch cc tc nhn gy kh?i pht hen suy?n v cch c th? phng trnh [place]. K? ho?ch ? c?ng bao g?m thng tin v?  vi?c khi no c?n dng thu?c v khi no c?n thay ??i li?u thu?c. M?t k? ho?ch th?c hi?n c?ng c th? bao g?m vi?c s? d?ng m?t d?ng c? g?i l l?u l??ng ??nh k?. L?u l??ng ??nh k? ?o kh? n?ng ho?t ??ng c?a ph?i. N gip gim st tnh tr?ng c?a qu v?. H??NG D?N CH?M [place] T?I NH   Ch? s? d?ng thu?c theo ch? d?n c?a chuyn gia ch?m [place] s?c kh?e. Hy ni v?i chuyn gia ch?m Eugenio Saenz s?c kh?e n?u qu v? c cc cu h?i v? cch th?c v th?i gian dng thu?c.  S? d?ng l?u l??ng ??nh k? theo ch? d?n c?a chuyn gia ch?m [place] s?c kh?e. Ghi l?i v theo di cc k?t qu? ?o.  Hi?u v s? d?ng k? ho?ch th?c hi?n ?? gip gi?m thi?u ho?c ng?n ch?n c?n hen m khng c?n ph?i ?i khm.  Ki?m sot mi tr??ng trong nh c?a qu v? theo nh?ng cch sau ?? gip ng?n ch?n c?n hen:  Khng ht thu?c. [place] b? ti?p xc v?i khi thu?c th? ??ng.  Thay b? l?c l s??i v ?i?u ho khng kh th??ng xuyn.  H?n ch? s?  d?ng l s??i v b?p c?i.  Di?t v?t gy h?i (ch?ng h?n nh? gin v chu?t) v lo?i b? phn c?a chng.  V?t b? cy n?u qu v? th?y chng b? n?m m?c.  Th??ng xuyn lm s?ch sn nh v b?i b?n. S? d?ng s?n ph?m lm s?ch khng mi.  C? g?ng nh? m?t ng??i khc th??ng xuyn ht b?i cho qu v?. Khng vo cc phng trong lc ?ang ht b?i v m?t lc sau ?Marland Kitchen N?u qu v? ht b?i, hy s? d?ng m?t m?t n? ch?ng b?i mua ? c?a hng ?? dng trong nh, m?t ti ch?a b?i hai l?p ho?c ti c b? l?c trong my ht b?i, ho?c m?t my ht b?i c b? l?c HEPA.  Thay th? th?m b?ng sn g?, ? lt ho?c nh?a vinyl. Th?m c th? dnh lng v b?i.  S? d?ng g?i, ga tr?i gi??ng v ga tr?i n?m l xo ch?ng d? ?ng.  Gi?t kh?n tr?i gi??ng v ch?n hng tu?n b?ng n??c nng v dng my s?y s?y kh.  S? d?ng ch?n lm b?ng polyester ho?c v?i bng.  Lm s?ch nh v? sinh v b?p b?ng ch?t t?y. N?u c th?, hy s?n l?i t??ng trong cc phng ny b?ng lo?i s?n ch?ng m?c. Trnh xa cc phng ?ang ???c lm s?ch v ?ang ???c s?n.  R?a tay th??ng xuyn. ?I [facility] N?U:   Qu v? th? kh kh, kh th? ho?c ho ngay c? khi ? dng thu?c ?? ng?n ch?n c?n hen.  Ch?t nhy c mu m qu v? ho ra (??m) ??c h?n bnh th??ng.  ??m c?a qu v? thay ??i t? khng mu (trong) ho?c mu tr?ng sang mu vng, xanh l cy, xm ho?c c mu.  Qu v? c b?t k? v?n ?? no lin quan ??n thu?c ?ang s? d?ng (ch?ng h?n nh? pht ban, ng?a, s?ng ho?c kh th?).  Qu v? s? d?ng thu?c lm gi?m hen suy?n nhi?u h?n 2-3 l?n m?i tu?n.  L?u l??ng ??nh c?a qu v? v?n ? kho?ng 50-79% ch? s? c nhn t?t nh?t sau khi lm theo k? ho?ch th?c hi?n c?a qu v? trong 1 ti?ng.  Qu v? b? s?t. NGAY L?P T?C ?I [facility] N?U:   Qu v? d??ng nh? b? n?ng h?n v khng ?p ?ng v?i vi?c ?i?u  tr? trong c?n hen.  Qu v? kh th? ngay c? khi ngh?Ladell Heads v? kh th? khi th?c hi?n ho?t ??ng th? ch?t r?t nh?Ladell Heads v? g?p kh kh?n khi ?n, u?ng ho?c ni chuy?n do cc tri?u ch?ng hen suy?n.  Qu v? b? ?au  ng?c.  Qu v? c nh?p tim nhanh.  Mi ho?c mng tay c?a qu v? c mu xanh nh?t.  Qu v? b? chong vng, chng m?t ho?c ng?t.  L?u l??ng ??nh c?a qu v? d??i 50% ch? s? c nhn t?t nh?t.   Thng tin ny khng nh?m m?c ?ch thay th? cho l?i khuyn m chuyn gia ch?m [place] s?c kh?e ni v?i qu v?. Hy b?o ??m qu v? ph?i th?o lu?n b?t k? v?n ?? g m qu v? c v?i chuyn gia ch?m [place] s?c kh?e c?a qu v?.   Document Released: 11/24/2005 Document Revised: 08/15/2015 Elsevier Interactive Patient Education [employer]       Return if symptoms worsen or fail to improve.   HOPPER,DAVID, MD 02/07/2016

## 2016-11-11 ENCOUNTER — Ambulatory Visit (INDEPENDENT_AMBULATORY_CARE_PROVIDER_SITE_OTHER): Payer: BLUE CROSS/BLUE SHIELD

## 2016-11-11 ENCOUNTER — Ambulatory Visit (INDEPENDENT_AMBULATORY_CARE_PROVIDER_SITE_OTHER): Payer: BLUE CROSS/BLUE SHIELD | Admitting: Urgent Care

## 2016-11-11 VITALS — BP 122/72 | HR 97 | Temp 98.2°F | Resp 17 | Ht 63.5 in | Wt 139.0 lb

## 2016-11-11 DIAGNOSIS — R05 Cough: Secondary | ICD-10-CM

## 2016-11-11 DIAGNOSIS — R0789 Other chest pain: Secondary | ICD-10-CM | POA: Diagnosis not present

## 2016-11-11 DIAGNOSIS — R0981 Nasal congestion: Secondary | ICD-10-CM

## 2016-11-11 DIAGNOSIS — J4540 Moderate persistent asthma, uncomplicated: Secondary | ICD-10-CM

## 2016-11-11 DIAGNOSIS — R059 Cough, unspecified: Secondary | ICD-10-CM

## 2016-11-11 DIAGNOSIS — R079 Chest pain, unspecified: Secondary | ICD-10-CM | POA: Diagnosis not present

## 2016-11-11 LAB — POCT CBC
GRANULOCYTE PERCENT: 70.2 % (ref 37–80)
HEMATOCRIT: 42.5 % — AB (ref 43.5–53.7)
HEMOGLOBIN: 14.3 g/dL (ref 14.1–18.1)
Lymph, poc: 2.3 (ref 0.6–3.4)
MCH: 22.5 pg — AB (ref 27–31.2)
MCHC: 33.6 g/dL (ref 31.8–35.4)
MCV: 66.9 fL — AB (ref 80–97)
MID (CBC): 0.2 (ref 0–0.9)
MPV: 9.2 fL (ref 0–99.8)
POC GRANULOCYTE: 5.8 (ref 2–6.9)
POC LYMPH PERCENT: 27.5 %L (ref 10–50)
POC MID %: 2.3 % (ref 0–12)
Platelet Count, POC: 207 10*3/uL (ref 142–424)
RBC: 6.36 M/uL — AB (ref 4.69–6.13)
RDW, POC: 15 %
WBC: 8.2 10*3/uL (ref 4.6–10.2)

## 2016-11-11 MED ORDER — IPRATROPIUM BROMIDE 0.02 % IN SOLN
0.5000 mg | Freq: Once | RESPIRATORY_TRACT | Status: AC
  Administered 2016-11-11: 0.5 mg via RESPIRATORY_TRACT

## 2016-11-11 MED ORDER — MONTELUKAST SODIUM 10 MG PO TABS: 10.0000 mg | ORAL_TABLET | Freq: Every day | ORAL | 3 refills | Status: DC

## 2016-11-11 MED ORDER — PREDNISONE 20 MG PO TABS: ORAL_TABLET | ORAL | 0 refills | Status: DC

## 2016-11-11 MED ORDER — HYDROCODONE-HOMATROPINE 5-1.5 MG/5ML PO SYRP: 5.0000 mL | ORAL_SOLUTION | Freq: Every evening | ORAL | 0 refills | Status: DC | PRN

## 2016-11-11 MED ORDER — BECLOMETHASONE DIPROPIONATE 80 MCG/ACT IN AERS: 2.0000 | INHALATION_SPRAY | Freq: Two times a day (BID) | RESPIRATORY_TRACT | 11 refills | Status: DC

## 2016-11-11 MED ORDER — ALBUTEROL SULFATE (2.5 MG/3ML) 0.083% IN NEBU
2.5000 mg | INHALATION_SOLUTION | Freq: Once | RESPIRATORY_TRACT | Status: AC
  Administered 2016-11-11: 2.5 mg via RESPIRATORY_TRACT

## 2016-11-11 NOTE — Patient Instructions (Addendum)
Ho, Ng???i l??n (Cough, Adult) Ho l ph?n x? la?m sa?ch c? h?ng va? khi? qua?n cu?a quy? vi?. Ho gip ch?a lnh v b?o v? ph?i c?a quy? vi?. Thi?nh thoa?ng ho l bnh th??ng, nh?ng ho ke?m theo cc tri?u ch?ng khc ho?c ko di c th? l m?t d?u hi?u c?a m?t b?nh ly? c?n ?i?u tr?Marland Kitchen Ho c th? ko da?i ch? 2-3 tu?n (c?p tnh), ho?c n c th? ko di h?n 8 tu?n (m?n tnh). NGUYN NHN Ho th??ng la? do:  Hi?t vo cc ch?t gy kch ?ng ph?i c?a quy? vi?.  Nhi?m vi rt ho?c vi khu?n ???ng h h?p.  D? ?ng.  Hen suy?n.  Cha?y di?ch mu?i sau.  Ht thu?c l.  Axi?t tra?o ng???c t?? d? dy ln th?c qu?n (tro ng??c d? dy th??c qua?n).  M?t s? lo?i thu?c nh?t ??nh.  Nh??ng v?n ?? ph?i ma?n tnh, k? c? COPD (ho?c hi?m khi, ung th? ph?i).  Cc ti?nh tra?ng b?nh ly? kha?c ch?ng h?n nh? suy tim. H??NG D?N CH?M [place] T?I NH Ch  ??n nh?ng thay ??i v? tri?u ch?ng c?a qu v?. Ti?n hnh nh?ng hnh ??ng sau ?? gip gi?m kho? chi?u:  Ch? s? d?ng thu?c theo ch? d?n c?a chuyn gia ch?m [place] s?c kh?e cu?a quy? vi?.  N?u qu v? ???c k thu?c khng sinh, hy dng thu?c theo ch? d?n c?a chuyn gia ch?m [place] s?c kh?e. Khng d?ng u?ng thu?c khng sinh ngay c? khi qu v? b?t ??u c?m th?y ?? h?n.  Ni chuy?n v?i chuyn gia ch?m [place] s?c kh?e tr??c khi quy? vi? du?ng m?t lo?i thu?c gia?m ho.  U?ng ?? n??c ?? gi? cho n??c ti?u trong ho?c c mu vng nh?t.  N?u khng kh kh, ha?y s? d?ng m?t ma?y h?i n???c ho??c ma?y t?o ?m trong phng ng? ho?c nha? quy? vi? ?? gip la?m l?ng di?ch ti?t ra.  Trnh b?t c? ?i?u g la?m quy? vi? ho t?i n?i lm vi?c hay ? nh.  N?u quy? vi? ho n??ng h?n vo ban ?m, hy th? ng? ?? v? tr n??a ng?i n??a n??m.  [place] khi thu?c l. N?u qu v? ht thu?c, ha?y cai thu?c. N?u qu v? c?n gip ?? ?? cai thu?c, hy h?i chuyn gia ch?m [place] s?c kh?e.  Trnh caffeine.  Trnh u?ng r??u.  Ngh? ng?i khi c?n. ?I KHM N?U:  Qu v? c cc tri?u ch?ng m?i.  Quy? vi? ho ra  m?Anselmo Rod? vi? khng ??? ho sau 2-3 tu?n, ho?c quy? vi? ho n?ng h?n.  Qu v? khng th? ki?m sot c?n ho c?a mnh b?ng thu?c ho v quy? vi? b? m?t ng?.  Quy? vi? bi? ?au ma? tr?? nn n??ng h?n ho?c ?au m khng ki?m sot ????c b??ng thu?c gia?m ?au.  Qu v? b? s?t.  Qu v? b? s?t cn khng r nguyn nhn.  Qu v? ?? m? hi ban ?m. NGAY L?P T?C ?I KHM N?U:  Qu v? ho ra mu.  Qu v? b? kh th?.  Nh?p tim c?a quy? vi? r?t nhanh. Thng tin ny khng nh?m m?c ?ch thay th? cho l?i khuyn m chuyn gia ch?m [place] s?c kh?e ni v?i qu v?. Hy b?o ??m qu v? ph?i th?o lu?n b?t k? v?n ?? g m qu v? c v?i chuyn gia ch?m [place] s?c kh?e c?a qu v?. Document Released: 03/17/2016 Document Revised: 03/17/2016 Document Reviewed: 01/31/2015 Elsevier Interactive Patient Education  2017 Elsevier Inc.    Hen suy?n,  Ng??i l?n (Asthma, Adult) Hen suy?n l m?t tnh tr?ng b?nh l ti pht trong ?, ???ng h h?p b? th?t ch?t v h?p l?i. Hen suy?n c th? gy kh th?. N c th? gy ho, th? kh kh, v kh th? Cc ??t hen, hay cn g?i l cc c?n hen, c th? ? m?c ?? t? nh? ??n nguy hi?m ??n tnh m?ng. Khng th? ch?a kh?i ???c hen suy?n, nh?ng thu?c v thay ??i l?i s?ng c th? gip ki?m sot b?nh ny. NGUYN NHN Hen ???c cho l do cc y?u t? ???c th?a h??ng t? th? h? tr??c (di truy?n) v mi tr??ng, nh?ng nguyn nhn chnh xc v?n ch?a ???c xc ??nh. Hen c th? b? kh?i pht b?i cc d? ?ng nguyn, nhi?m trng ph?i, ho?c cc ch?t kch thch trong khng kh. Tc nhn gy hen suy?n khc nhau v?i m?i ng??i. Tc nhn ph? bi?n gy kh?i pht b?nh bao g?m:  V?y da ??ng v?t.  M?t b?i nh.  Gin.  Ph?n hoa c?a cy ho?c c?.  N?m m?c.  Khi thu?c.  Cc ch?t  nhi?m khng kh nh? b?i, ch?t t?y r?a ?? gia d?ng, ch?t x?t tc, ch?t phun x?t, h?i s?n, ha ch?t m?nh ho?c mi m?nh.  Khng kh l?nh, thay ??i th?i ti?t, v gi (lm t?ng l??ng n?m m?c v ph?n hoa trong khng kh).  Bi?u l? c?m xc m?nh nh? khc ho?c  c??i nhi?u.  C?ng th?ng.  M?t s? lo?i thu?c nh?t ??nh (nh? aspirin) ho?c cc lo?i thu?c (nh? thu?c ch?n b ta).  Ch?t sunfit trong th?c ?n v ?? u?ng. Th?c ?n v ?? u?ng c th? c ch?t sunfit, bao g?m tri cy kh, khoai ty chin, v n??c nho c ga.  Cc tnh tr?ng nhi?m trng ho?c vim nh? cm, c?m l?nh ho?c vim nim m?c m?i (vim m?i ).  B?nh tro ng??c d? dy th?c qu?n (GERD).  T?p luy?n ho?c ho?t ??ng c?ng th?ng. TRI?U CH?NG Cc tri?u ch?ng c th? di?n ra ngay sau khi hen suy?n kh?i pht ho?c nhi?u gi? sau ?. Cc tri?u ch?ng bao g?m:  Th? kh kh.  Ho nhi?u vo ban ?m ho?c bu?i sng s?m.  Ho th??ng xuyn ho?c ho d? d?i khi b? c?m l?nh thng th??ng.  T?c ng?c.  Kh th?. CH?N ?ON Ch?n ?on hen suy?n ???c th?c hi?n b?ng cch xem xt b?nh s? c?a qu v? v khm th?c th?. C?ng c th? ph?i lm cc ph?n ki?m tra. Nh?ng ki?m tra ny c th? bao g?m:  Nghin c?u ch?ng n?ng ph?i. Ki?m tra ny cho bi?t qu v? ht vo v th? ra ???c bao nhiu khng kh.  Ki?m tra d? ?ng.  Ki?m tra b?ng hnh ?nh nh? ch?p X quang. ?I?U TR? Hen suy?n khng th? ch?a kh?i ???c, nh?ng th??ng c th? ki?m sot ???c. ?i?u tr? bao g?m vi?c xc ??nh v phng trnh cc tc nhn gy kh?i pht hen suy?n cho qu v?. ?i?u tr? c?ng c th? bao g?m c? thu?c. C 2 lo?i thu?c ???c s? d?ng ?? ?i?u tr? hen suy?n:  Cc thu?c ki?m sot. Cc thu?c ny ng?n ng?a khng cho cc tri?u ch?ng hen suy?n x?y ra. Thu?c th??ng ???c dng m?i ngy.  Thu?c lm gi?m nh? ho?c c?p c?u. Nh?ng thu?c ny nhanh chng lm gi?m cc tri?u ch?ng hen suy?n. Cc thu?c ? th??ng ???c dng khi c?n thi?t v ?? gi?m nh? tri?u ch?ng trong th?i gian ng?n. Chuyn gia ch?m [place]  s?c kh?e s? gip qu v? xy d?ng m?t k? ho?ch th?c hi?n vi?c ki?m sot hen suy?n. M?t k? ho?ch th?c hi?n vi?c ki?m sot hen suy?n l m?t k? ho?ch ???c ??a ra ?? qu?n l v ?i?u tr? cc c?n hen suy?n c?a qu v?. N bao g?m m?t danh sch cc tc nhn gy kh?i pht hen suy?n v cch c  th? phng trnh [place]. K? ho?ch ? c?ng bao g?m thng tin v? vi?c khi no c?n dng thu?c v khi no c?n thay ??i li?u thu?c. M?t k? ho?ch th?c hi?n c?ng c th? bao g?m vi?c s? d?ng m?t d?ng c? g?i l l?u l??ng ??nh k?. L?u l??ng ??nh k? ?o kh? n?ng ho?t ??ng c?a ph?i. N gip gim st tnh tr?ng c?a qu v?. H??NG D?N CH?M [place] T?I NH  Ch? s? d?ng thu?c theo ch? d?n c?a chuyn gia ch?m [place] s?c kh?e. Hy ni v?i chuyn gia ch?m [place] s?c kh?e n?u qu v? c cc cu h?i v? cch th?c v th?i gian dng thu?c.  S? d?ng l?u l??ng ??nh k? theo ch? d?n c?a chuyn gia ch?m [place] s?c kh?e. Ghi l?i v theo di cc k?t qu? ?o.  Hi?u v s? d?ng k? ho?ch th?c hi?n ?? gip gi?m thi?u ho?c ng?n ch?n c?n hen m khng c?n ph?i ?i khm.  Ki?m sot mi tr??ng trong nh c?a qu v? theo nh?ng cch sau ?? gip ng?n ch?n c?n hen:  Khng ht thu?c. Young Berryrnh b? ti?p xc v?i khi thu?c th? ??ng.  Thay b? l?c l s??i v ?i?u ho khng kh th??ng xuyn.  H?n ch? s? d?ng l s??i v b?p c?i.  Di?t v?t gy h?i (ch?ng h?n nh? gin v chu?t) v lo?i b? phn c?a chng.  V?t b? cy n?u qu v? th?y chng b? n?m m?c.  Th??ng xuyn lm s?ch sn nh v b?i b?n. S? d?ng s?n ph?m lm s?ch khng mi.  C? g?ng nh? m?t ng??i khc th??ng xuyn ht b?i cho qu v?. Khng vo cc phng trong lc ?ang ht b?i v m?t lc sau ?Marland Kitchen. N?u qu v? ht b?i, hy s? d?ng m?t m?t n? ch?ng b?i mua ? c?a hng ?? dng trong nh, m?t ti ch?a b?i hai l?p ho?c ti c b? l?c trong my ht b?i, ho?c m?t my ht b?i c b? l?c HEPA.  Thay th? th?m b?ng sn g?, ? lt ho?c nh?a vinyl. Th?m c th? dnh lng v b?i.  S? d?ng g?i, ga tr?i gi??ng v ga tr?i n?m l xo ch?ng d? ?ng.  Gi?t kh?n tr?i gi??ng v ch?n hng tu?n b?ng n??c nng v dng my s?y s?y kh.  S? d?ng ch?n lm b?ng polyester ho?c v?i bng.  Lm s?ch nh v? sinh v b?p b?ng ch?t t?y. N?u c th?, hy s?n l?i t??ng trong cc phng ny b?ng lo?i s?n ch?ng m?c. Trnh xa cc phng ?ang ???c lm s?ch v  ?ang ???c s?n.  R?a tay th??ng xuyn. ?I KHM N?U:  Qu v? th? kh kh, kh th? ho?c ho ngay c? khi ? dng thu?c ?? ng?n ch?n c?n hen.  Ch?t nhy c mu m qu v? ho ra (??m) ??c h?n bnh th??ng.  ??m c?a qu v? thay ??i t? khng mu (trong) ho?c mu tr?ng sang mu vng, xanh l cy, xm ho?c c mu.  Qu v? c b?t k? v?n ?? no lin quan ??n thu?c ?ang s? d?ng (ch?ng h?n nh? pht ban, ng?a, s?ng  ho?c kh th?).  Qu v? s? d?ng thu?c lm gi?m hen suy?n nhi?u h?n 2-3 l?n m?i tu?n.  L?u l??ng ??nh c?a qu v? v?n ? kho?ng 50-79% ch? s? c nhn t?t nh?t sau khi lm theo k? ho?ch th?c hi?n c?a qu v? trong 1 ti?ng.  Qu v? b? s?t. NGAY L?P T?C ?I KHM N?U:  Qu v? d??ng nh? b? n?ng h?n v khng ?p ?ng v?i vi?c ?i?u tr? trong c?n hen.  Qu v? kh th? ngay c? khi ngh?Ladell Heads v? kh th? khi th?c hi?n ho?t ??ng th? ch?t r?t nh?Ladell Heads v? g?p kh kh?n khi ?n, u?ng ho?c ni chuy?n do cc tri?u ch?ng hen suy?n.  Qu v? b? ?au ng?c.  Qu v? c nh?p tim nhanh.  Mi ho?c mng tay c?a qu v? c mu xanh nh?t.  Qu v? b? chong vng, chng m?t ho?c ng?t.  L?u l??ng ??nh c?a qu v? d??i 50% ch? s? c nhn t?t nh?t. Thng tin ny khng nh?m m?c ?ch thay th? cho l?i khuyn m chuyn gia ch?m [place] s?c kh?e ni v?i qu v?. Hy b?o ??m qu v? ph?i th?o lu?n b?t k? v?n ?? g m qu v? c v?i chuyn gia ch?m [place] s?c kh?e c?a qu v?. Document Released: 11/24/2005 Document Revised: 08/15/2015 Document Reviewed: 06/23/2013 Elsevier Interactive Patient Education  2017 Elsevier Inc.      Cc b??c b? ht thu?c (Steps to Quit Smoking) Ht thu?c c th? c h?i cho s?c kh?e c?a qu v? v c th? ?nh h??ng ??n h?u h?t cc c? quan trong c? th? qu v?. Ht thu?c s? lm qu v? v nh?ng ng??i xung quanh c nguy c? pht sinh nhi?u b?nh m?n tnh nghim tr?ng. Kh b? ht thu?c, nh?ng ? l m?t trong nh?ng ?i?u t?t nh?t qu v? c th? lm v s?c kh?e c?a qu v?. B? ht thu?c khng bao gi? qu mu?n. NH?NG L?I CH  C?A VI?C B? HT THU?C L L G? Khi b? ht thu?c, qu v? s? gi?m nguy c? pht sinh cc b?nh v tnh tr?ng nghim tr?ng, ch?ng h?n nh?:  Ung th? ph?i ho?c b?nh ph?i, ch?ng h?n nh? COPD.  B?nh tim.  ??t qu?Marland Kitchen  Nh?i mu c? tim.  V sinh.  Long x??ng v gy x??ng. Ngoi ra, cc tri?u ch?ng nh? ho, th? kh kh, kh th? c th? ?? h?n khi qu v? b? ht thu?c. Qu v? c?ng c th? th?y t b? ?m h?n v c? th? qu v? c s?c ?? khng m?nh h?n v?i c?m l?nh v nhi?m trng. N?u qu v? mang thai, b? ht thu?c c th? gip gi?m nguy c? sinh con nh? cn. TI C?N S?N SNG B? HT THU?C NH? TH? NO? N?u qu v? quy?t ??nh b? ht thu?c, hy l?p m?t k? ho?ch ?? ch?c ch?n qu v? s? thnh cng. Tr??c khi qu v? b? ht thu?c:  Hy ch?n m?t ngy ?? b? ht thu?c. ??nh ra m?t ngy trong hai tu?n t?i ?? qu v? c th?i gian chu?n b?.  Ghi l?i nh?ng l do m qu v? b? ht thu?c. Gi? danh sch ny ? nh?ng n?i m qu v? nhn th?y th??ng xuyn, ch?ng h?n nh? ? g??ng c?a phng t?m ho?c trong xe h?i ho?c trong v.  Xc ??nh nh?ng ng??i, ??a ?i?m, ?? v?t v ho?t ??ng lm cho qu v? mu?n ht thu?c (tc nhn kh?i pht) v trnh nh?ng tc nhn ?Marland Kitchen Hy  ch?c ch?n th?c hi?n nh?ng hnh ??ng sau:  V?t b? t?t c? thu?c l ? nh, n?i lm vi?c v trn xe h?i.  V?t b? cc ?? dng ?? ht thu?c l, ch?ng h?n nh? g?t tn v b?t l?a.  V? sinh xe h?i v ch?c ch?n ph?i v? sinh g?t tn thu?c l.  V? sinh nh c?a, k? c? rm c?a v th?m.  Ni v?i gia ?nh, b?n b v ??ng nghi?p r?ng qu v? ?ang b? ht thu?c. Vi?c h? tr? c?a ng??i thn c th? gip qu v? b? ht thu?c d? dng h?n.  Hy ni chuy?n v?i chuyn gia ch?m [place] s?c kh?e v? nh?ng ph??ng n b? ht thu?c.  Tm hi?u nh?ng ph??ng n ?i?u tr? ???c b?o hi?m y t? c?a qu v? bao tr?Raynelle Chary TH? S? D?NG NH?NG CHI?N L??C G ?? B? HT THU?C? Hy ni chuy?n v?i chuyn gia ch?m [place] s?c kh?e v? nh?ng chi?n l??c b? ht thu?c khc nhau. M?t s? chi?n l??c bao g?m:  B? ht thu?c hon ton thay v  gi?m d?n l??ng thu?c ht trong m?t th?i gian. Cc nghin c?u cho th?y vi?c b? ht thu?c hon ton thnh cng h?n b? ht thu?c d?n d?n.  G?p xin t? v?n tr?c ti?p ?? gip pht tri?n k? n?ng gi?i quy?t v?n ??Ladell Heads v? c kh? n?ng thnh cng h?n trong vi?c b? ht thu?c n?u qu v? tham gia m?t vi bu?i t? v?n. Ngay c? nh?ng bu?i t? v?n ko di 10 pht c?ng c th? c hi?u qu?Marland Kitchen  Tm nh?ng ngu?n v h? th?ng h? tr? m c th? gip qu v? b? ht thu?c v duy tr tnh tr?ng khng khi thu?c sau khi b? ht thu?c. Nh?ng ngu?n ny l h?u ch nh?t khi qu v? s? d?ng chng th??ng xuyn. Nh?ng ngu?n ? c th? bao g?m:  Tr chuy?n tr?c tuy?n v?i chuyn gia t? v?n.  ???ng ?i?n tho?i t? v?n cai thu?c.  Ti li?u in t? tham kh?o.  Cc nhm h? tr? ho?c t? v?n theo nhm.  Cc ch??ng trnh g?i tin nh?n.  Cc ?ng d?ng ?i?n tho?i di ??ng.  Dng thu?c ?? gip qu v? b? ht thu?c. (N?u qu v? mang thai ho?c ?ang cho con b, ni chuy?n tr??c v?i chuyn gia ch?m [place] s?c kh?e). M?t s? thu?c c ch?a nicotine v s? khc th khng. C? hai lo?i thu?c ??u gip b?t thm thu?c, nh?ng nh?ng thu?c c ch?a nicotine gip gi?m nh? cc tri?u ch?ng cai thu?c. Chuyn gia ch?m [place] s?c kh?e c?a qu v? c th? khuy?n ngh?:  Mi?ng dn nicotine, k?o nhai, ho?c thu?c vin.  ?ng ht ho?c bnh x?t nicotine.  Thu?c khng c ch?a nicotine dng qua ???ng u?ng. Ni chuy?n v?i chuyn gia ch?m [place] s?c kh?e v? vi?c k?t h?p cc chi?n l??c, ch?ng h?n dng thu?c trong khi qu v? c?ng ?ang ???c t? v?n tr?c ti?p. S? d?ng hai chi?n l??c ny cng m?t lc lm cho qu v? c kh? n?ng thnh cng h?n trong vi?c b? ht thu?c so v?i khi qu v? ch? s? d?ng ring m?t chi?n l??c. N?u qu v? mang thai ho?c cho con b, hy ni chuy?n v?i chuyn gia ch?m [place] s?c kh?e ?? tm cc ch??ng trnh t? v?n ho?c h? tr? khc ?? b? ht thu?c. Khng dng thu?c ?? b? ht thu?c tr? khi chuyn gia ch?m [place] s?c kh?e b?o qu v? lm nh? v?y. TI C TH?  LM NH?NG VI?C G ?? B? HT THU?C D?  H?N? B? ht thu?c lc ??u c v? kh kh?n, nh?ng c nhi?u vi?c qu v? c th? lm ?? cho n d? dng h?n. Th?c hi?n nh?ng hnh ??ng quan tr?ng sau:  Nh? gia ?nh v b?n b v yu c?u h? h? tr? v ??ng vin trong th?i gian ny. G?i ?i?n ??n ???ng dy t? v?n cai nghi?n, nh? cc nhm h? tr? ho?c lm vi?c v?i m?t chuyn gia t? v?n ?? ???c h? tr?.  ?? ngh? nh?ng ng??i ht thu?c trnh ht thu?c c?nh qu v?.  Trnh nh?ng n?i lm kh?i pht nhu c?u ht thu?c, ch?ng h?n nh? qun r??u, ti?c lin hoan ho?c khu v?c gi?i lao ht thu?c ? n?i lm vi?c.  Dnh th?i gian ? c?nh nh?ng ng??i khng ht thu?c.  Gi?m c?ng th?ng trong cu?c s?ng, v c?ng th?ng l tc nhn kh?i pht ht thu?c ??i v?i m?t s? ng??i. ?? gi?m c?ng th?ng, hy c? g?ng:  T?p th? d?c th??ng xuyn.  T?p cc bi t?p th? su.  T?p yoga.  Thi?n.  Ch?p qut c? th?. Vi?c ch?p qut ?i h?i qu v? ph?i nh?m m?t v c? th? ???c ch?p qut t? ??u ??n chn ?? pht hi?n nh?ng b? ph?n ??c bi?t c?ng th?ng. Ch? tm th? l?ng cc c? ? nh?ng khu v?c ?.  T?i v? ho?c mua cc ch??ng trnh ?ng d?ng (?ng d?ng) dng cho ?i?n tho?i di ??ng ho?c my tnh b?ng m c th? gip qu v? theo ?ng k? ho?ch cai thu?c thng qua cc l?i nh?c, l?i khuyn v ??ng vin. C nhi?u ?ng d?ng mi?n ph, ch?ng h?n QuitGuide c?a CDC (Trung tm ki?m sot v phng ch?ng b?nh t?t) Qu v? c th? tm s? tr? gip khc ?? b? ht thu?c (ng?ng ht thu?c) thng qua smokefree.gov v cc website khc. TI S? C?M TH?Y TH? NO KHI B? HT THU?C? Trong vng 24 gi? ??u t? khi b? ht thu?c, qu v? c th? b?t ??u c?m th?y m?t s? tri?u ch?ng cai thu?c. Nh?ng tri?u ch?ng ny th??ng d? nh?n ra nh?t trong 2-3 ngy sau khi b? thu?c, nh?ng th??ng khng ko di qu 2-3 tu?n. Nh?ng thay ??i ho?c tri?u ch?ng m qu v? c th? c?m nh?n bao g?m:  Thay ??i tm tr?ng.  B?n ch?n, lo l?ng ho?c d? cu k?nh.  Kh t?p trung.  Chng m?t.  Ngoi nicotine cn thm cc lo?i th?c ?n ng?t.  T?ng cn m?t cht.  To  bn.  Bu?n nn.  Ho ho?c vim h?ng.  Thay ??i c? ch? tc d?ng c?a cc lo?i thu?c trong c? th? qu v?.  Tr?ng thi tr?m c?m.  Kh ng? (m?t ng?). Sau 2-3 tu?n ??u tin b? ht thu?c, qu v? c th? b?t ??u nh?n th?y cc k?t qu? tch c?c h?n, ch?ng h?n nh?:  C?i thi?n kh?u gic v v? gic.  Gi?m ho v gi?m vim h?ng.  Nh?p tim ch?m h?n.  Huy?t p th?p h?n.  Da sng h?n.  Th? d? dng h?n.  t ngy ?m h?n. B? ht thu?c l r?t kh ??i v?i h?u h?t m?i ng??i. Khng nh?t ch n?u qu v? khng thnh cng trong l?n ??u. M?t s? ng??i c?n nhi?u l?n c g?ng b? ht thu?c tr??c khi h? c ???c thnh cng lu di. H?t s?c c? g?ng th?c hi?n theo ?ng k? ho?ch b? ht thu?c v ni chuy?n v?i chuyn gia ch?m   s?c kh?e n?u qu v? c b?t k? th?c m?c ho?c lo ng?i no. Thng tin ny khng nh?m m?c ?ch thay th? cho l?i khuyn m chuyn gia ch?m [place] s?c kh?e ni v?i qu v?. Hy b?o ??m qu v? ph?i th?o lu?n b?t k? v?n ?? g m qu v? c v?i chuyn gia ch?m [place] s?c kh?e c?a qu v?. Document Released: 03/11/2007 Document Revised: 04/10/2015 Document Reviewed: 04/10/2015 Elsevier Interactive Patient Education  2017 [employer]     IF you received an x-ray today, you will receive an invoice from [facility]. Please contact [facility] at [phone] with questions or concerns regarding your invoice.   IF you received labwork today, you will receive an invoice from [employer]. Please contact [facility] at [phone] with questions or concerns regarding your invoice.   Our billing staff will not be able to assist you with questions regarding bills from these companies.  You will be contacted with the lab results as soon as they are available. The fastest way to get your results is to activate your My Chart account. Instructions are located on the last page of this paperwork. If you have not heard from [place] regarding the results in 2 weeks, please contact this  office.

## 2016-11-11 NOTE — Progress Notes (Signed)
MRN: [id number] DOB: 09/20/1986  Subjective:   Timothy Adkins is a 30 y.o. male presenting for chief complaint of Cough and Asthma  Reports 1 week history of productive cough with intermittent hemoptysis. Cough is associated with chest pain, shob, wheezing. Has also had intermittent sore throat. Has been using his albuterol inhaler 2 times per day. Also uses inhaler 2-5 times per night. Uses QVAR twice daily. Denies fever, sinus pain, chills, n/v, abdominal pain. Smokes 3-5 cigarettes per day, still smoking. Quit smoking ~3 years ago for 1 year. Denies history of allergies. Does not recall anyone telling him that he needed to take Flonase consistently.  [name] has a current medication list which includes the following prescription(s): albuterol, beclomethasone, fluticasone, guaifenesin-dextromethorphan, and prednisone. Also has No Known Allergies.  Timothy Adkins  has a past medical history of Asthma. Also  has no past surgical history on file.   Objective:   Vitals: BP 122/72 (BP Location: Right Arm, Patient Position: Sitting, Cuff Size: Normal)   Pulse 97   Temp 98.2 F (36.8 C) (Oral)   Resp 17   Ht 5' 3.5" (1.613 m)   Wt 139 lb (63 kg)   SpO2 97%   BMI 24.24 kg/m   Wt Readings from Last 3 Encounters:  11/11/16 139 lb (63 kg)  02/07/16 143 lb 12.8 oz (65.2 kg)  01/04/16 142 lb (64.4 kg)    Physical Exam  Constitutional: He is oriented to person, place, and time. He appears well-developed and well-nourished.  Cardiovascular: Normal rate, regular rhythm and intact distal pulses.  Exam reveals no gallop and no friction rub.   No murmur heard. Pulmonary/Chest: No respiratory distress. He has wheezes (bilaterally, most prominent in mid-upper lung fields). He has no rales.  Neurological: He is alert and oriented to person, place, and time.   Dg Chest 2 View  Result Date: 11/11/2016 CLINICAL DATA:  Cough and wheezing for the past week associated with atypical chest pain. The patient is a current  smoker. EXAM: CHEST  2 VIEW COMPARISON:  None in PACs FINDINGS: The lungs are hyperinflated with increased AP dimension of the thorax. There is no alveolar infiltrate. There is no pleural effusion. The interstitial markings are mildly prominent. The cardiopericardial silhouette is normal. The mediastinum is normal in width. The bony thorax is unremarkable. IMPRESSION: Acute bronchitic-smoking related changes. No pneumonia or CHF nor other acute cardiopulmonary abnormality. Electronically Signed   By: David  SwazilandJordan M.D.   On: 11/11/2016 16:58   Results for orders placed or performed in visit on 11/11/16 (from the past 24 hour(s))  POCT CBC     Status: Abnormal   Collection Time: 11/11/16  5:32 PM  Result Value Ref Range   WBC 8.2 4.6 - 10.2 K/uL   Lymph, poc 2.3 0.6 - 3.4   POC LYMPH PERCENT 27.5 10 - 50 %L   MID (cbc) 0.2 0 - 0.9   POC MID % 2.3 0 - 12 %M   POC Granulocyte 5.8 2 - 6.9   Granulocyte percent 70.2 37 - 80 %G   RBC 6.36 (A) 4.69 - 6.13 M/uL   Hemoglobin 14.3 14.1 - 18.1 g/dL   HCT, POC 40.942.5 (A) 81.143.5 - 53.7 %   MCV 66.9 (A) 80 - 97 fL   MCH, POC 22.5 (A) 27 - 31.2 pg   MCHC 33.6 31.8 - 35.4 g/dL   RDW, POC 91.415.0 %   Platelet Count, POC 207 142 - 424 K/uL   MPV  9.2 0 - 99.8 fL   Assessment and Plan :   1. Cough 2. Atypical chest pain 3. Nasal congestion - Likely undergoing viral bronchitis, worsened by his smoking. Advised supportive care for now. - If no improvement or symptoms do not resolve return to clinic in 3 days.   4. Moderate persistent asthma without complication - Recommended smoking cessation. - Start singulair, schedule albuterol. Use short steroid course. Increase QVAR to 80mcg BID. - Follow up in 4 weeks, consider referral back to pulmonology.  Wallis BambergMario Hailea Eaglin, PA-C [facility] [phone] 11/11/2016 4:27 PM

## 2016-12-02 ENCOUNTER — Ambulatory Visit (INDEPENDENT_AMBULATORY_CARE_PROVIDER_SITE_OTHER): Payer: BLUE CROSS/BLUE SHIELD | Admitting: Emergency Medicine

## 2016-12-02 VITALS — BP 106/66 | HR 99 | Temp 99.5°F | Resp 18 | Ht 63.5 in | Wt 138.0 lb

## 2016-12-02 DIAGNOSIS — R05 Cough: Secondary | ICD-10-CM | POA: Diagnosis not present

## 2016-12-02 DIAGNOSIS — R059 Cough, unspecified: Secondary | ICD-10-CM

## 2016-12-02 DIAGNOSIS — J45909 Unspecified asthma, uncomplicated: Secondary | ICD-10-CM | POA: Insufficient documentation

## 2016-12-02 DIAGNOSIS — J4521 Mild intermittent asthma with (acute) exacerbation: Secondary | ICD-10-CM

## 2016-12-02 MED ORDER — AMOXICILLIN-POT CLAVULANATE 875-125 MG PO TABS: 1.0000 | ORAL_TABLET | Freq: Two times a day (BID) | ORAL | 0 refills | Status: AC

## 2016-12-02 MED ORDER — PREDNISONE 20 MG PO TABS: ORAL_TABLET | ORAL | 0 refills | Status: DC

## 2016-12-02 NOTE — Progress Notes (Signed)
Timothy Adkins 30 y.o.   Chief Complaint  Patient presents with  . Cough  . Headache   Last CXR 11/11/16 reviewed. Normal. HISTORY OF PRESENT ILLNESS: This is a 30 y.o. male complaining of cough and congestion x 2 days; has h/o asthma. Son also sick recently with same.  Cough  This is a new problem. The current episode started in the past 7 days (since last Sunday). The problem has been gradually worsening. The problem occurs constantly. The cough is non-productive. Associated symptoms include headaches, nasal congestion and wheezing. Pertinent negatives include no chest pain, ear congestion, fever, heartburn, hemoptysis, myalgias, postnasal drip, rash or shortness of breath. Nothing aggravates the symptoms. He has tried a beta-agonist inhaler and steroid inhaler for the symptoms. The treatment provided mild relief. His past medical history is significant for asthma.     Prior to Admission medications   Medication Sig Start Date End Date Taking? Authorizing Provider  albuterol (PROVENTIL HFA;VENTOLIN HFA) 108 (90 Base) MCG/ACT inhaler Inhale 2 puffs into the lungs every 4 (four) hours as needed for wheezing or shortness of breath (cough, shortness of breath or wheezing.). 02/07/16  Yes Peyton Najjaravid H Hopper, MD  beclomethasone (QVAR) 80 MCG/ACT inhaler Inhale 2 puffs into the lungs 2 (two) times daily. 11/11/16  Yes Wallis BambergMario Mani, PA-C  fluticasone (FLONASE) 50 MCG/ACT nasal spray Place 2 sprays into both nostrils daily. 06/29/14  Yes Ozella Rocksavid J Merrell, MD  guaiFENesin-dextromethorphan (ROBITUSSIN DM) 100-10 MG/5ML syrup Take 5 mLs by mouth every 4 (four) hours as needed for cough. 01/04/16  Yes Tatyana Kirichenko, PA-C  HYDROcodone-homatropine (HYCODAN) 5-1.5 MG/5ML syrup Take 5 mLs by mouth at bedtime as needed. 11/11/16  Yes Wallis BambergMario Mani, PA-C  montelukast (SINGULAIR) 10 MG tablet Take 1 tablet (10 mg total) by mouth at bedtime. 11/11/16  Yes Wallis BambergMario Mani, PA-C  predniSONE (DELTASONE) 20 MG tablet Take 2 tablets  daily with breakfast. 11/11/16  Yes Wallis BambergMario Mani, PA-C    No Known Allergies  There are no active problems to display for this patient.   Past Medical History:  Diagnosis Date  . Asthma     History reviewed. No pertinent surgical history.  Social History   Social History  . Marital status: Single    Spouse name: N/A  . Number of children: N/A  . Years of education: N/A   Occupational History  . Not on file.   Social History Main Topics  . Smoking status: Current Every Day Smoker    Packs/day: 0.25    Years: 10.00  . Smokeless tobacco: Never Used  . Alcohol use Yes  . Drug use: No  . Sexual activity: Yes    Birth control/ protection: Condom   Other Topics Concern  . Not on file   Social History Narrative  . No narrative on file    History reviewed. No pertinent family history.   Review of Systems  Constitutional: Negative for fever and malaise/fatigue.  HENT: Positive for congestion. Negative for postnasal drip.   Eyes: Negative.   Respiratory: Positive for cough and wheezing. Negative for hemoptysis, sputum production and shortness of breath.   Cardiovascular: Negative for chest pain, palpitations, orthopnea and leg swelling.  Gastrointestinal: Negative for abdominal pain, constipation, diarrhea, heartburn, nausea and vomiting.  Genitourinary: Negative.   Musculoskeletal: Negative for back pain, myalgias and neck pain.  Skin: Negative for rash.  Neurological: Positive for headaches. Negative for dizziness, focal weakness and weakness.  Endo/Heme/Allergies: Negative.   Psychiatric/Behavioral: Negative.  Vitals:   12/02/16 0957  BP: 106/66  Pulse: 99  Resp: 18  Temp: 99.5 F (37.5 C)    Physical Exam  Constitutional: He is oriented to person, place, and time. He appears well-developed and well-nourished.  HENT:  Head: Normocephalic and atraumatic.  Eyes: Conjunctivae and EOM are normal. Pupils are equal, round, and reactive to light.  Neck:  Normal range of motion. Neck supple.  Cardiovascular: Normal rate, regular rhythm, normal heart sounds and intact distal pulses.   Pulmonary/Chest: Effort normal. He has wheezes.  +rhonchi  Abdominal: Soft. Bowel sounds are normal. There is no tenderness.  Musculoskeletal: Normal range of motion.  Neurological: He is alert and oriented to person, place, and time.  Skin: Skin is warm and dry.  Vitals reviewed.    ASSESSMENT & PLAN: Cobie was seen today for cough and headache.  Diagnoses and all orders for this visit:  Mild intermittent asthmatic bronchitis with acute exacerbation -     amoxicillin-clavulanate (AUGMENTIN) 875-125 MG tablet; Take 1 tablet by mouth 2 (two) times daily. -     Discontinue: predniSONE (DELTASONE) 20 MG tablet; Take 2 tablets daily with breakfast. -     predniSONE (DELTASONE) 20 MG tablet; Take 2 tablets daily with breakfast. -     Care order/instruction:  Cough   Bronchitis/asthma instructions given. Return if worse.  Edwina Barth, MD [facility]

## 2016-12-02 NOTE — Patient Instructions (Addendum)
   IF you received an x-ray today, you will receive an invoice from [facility]. Please contact [facility] at [phone] with questions or concerns regarding your invoice.   IF you received labwork today, you will receive an invoice from [facility]. Please contact [facility] at [phone] with questions or concerns regarding your invoice.   Our billing staff will not be able to assist you with questions regarding bills from these companies.  You will be contacted with the lab results as soon as they are available. The fastest way to get your results is to activate your My Chart account. Instructions are located on the last page of this paperwork. If you have not heard from us regarding the results in 2 weeks, please contact this office.     Asthma, Adult Asthma is a condition of the lungs in which the airways tighten and narrow. Asthma can make it hard to breathe. Asthma cannot be cured, but medicine and lifestyle changes can help control it. Asthma may be started (triggered) by:  Animal skin flakes (dander).  Dust.  Cockroaches.  Pollen.  Mold.  Smoke.  Cleaning products.  Hair sprays or aerosol sprays.  Paint fumes or strong smells.  Cold air, weather changes, and winds.  Crying or laughing hard.  Stress.  Certain medicines or drugs.  Foods, such as dried fruit, potato chips, and sparkling grape juice.  Infections or conditions (colds, flu).  Exercise.  Certain medical conditions or diseases.  Exercise or tiring activities.  Follow these instructions at home:  Take medicine as told by your doctor.  Use a peak flow meter as told by your doctor. A peak flow meter is a tool that measures how well the lungs are working.  Record and keep track of the peak flow meter's readings.  Understand and use the asthma action plan. An asthma action plan is a written plan for taking care of your asthma and treating your attacks.  To help prevent  asthma attacks: ? Do not smoke. Stay away from secondhand smoke. ? Change your heating and air conditioning filter often. ? Limit your use of fireplaces and wood stoves. ? Get rid of pests (such as roaches and mice) and their droppings. ? Throw away plants if you see mold on them. ? Clean your floors. Dust regularly. Use cleaning products that do not smell. ? Have someone vacuum when you are not home. Use a vacuum cleaner with a HEPA filter if possible. ? Replace carpet with wood, tile, or vinyl flooring. Carpet can trap animal skin flakes and dust. ? Use allergy-proof pillows, mattress covers, and box spring covers. ? Wash bed sheets and blankets every week in hot water and dry them in a dryer. ? Use blankets that are made of polyester or cotton. ? Clean bathrooms and kitchens with bleach. If possible, have someone repaint the walls in these rooms with mold-resistant paint. Keep out of the rooms that are being cleaned and painted. ? Wash hands often. Contact a doctor if:  You have make a whistling sound when breaking (wheeze), have shortness of breath, or have a cough even if taking medicine to prevent attacks.  The colored mucus you cough up (sputum) is thicker than usual.  The colored mucus you cough up changes from clear or white to yellow, green, gray, or bloody.  You have problems from the medicine you are taking such as: ? A rash. ? Itching. ? Swelling. ? Trouble breathing.  You need reliever medicines more than   times a week.  Your peak flow measurement is still at 50-79% of your personal best after following the action plan for 1 hour.  You have a fever. Get help right away if:  You seem to be worse and are not responding to medicine during an asthma attack.  You are short of breath even at rest.  You get short of breath when doing very little activity.  You have trouble eating, drinking, or talking.  You have chest pain.  You have a fast heartbeat.  Your  lips or fingernails start to turn blue.  You are light-headed, dizzy, or faint.  Your peak flow is less than 50% of your personal best. This information is not intended to replace advice given to you by your health care provider. Make sure you discuss any questions you have with your health care provider. Document Released: 05/12/2008 Document Revised: 05/01/2016 Document Reviewed: 06/23/2013 Elsevier Interactive Patient Education  2017 Elsevier Inc.  Acute Bronchitis, Adult Acute bronchitis is when air tubes (bronchi) in the lungs suddenly get swollen. The condition can make it hard to breathe. It can also cause these symptoms:  A cough.  Coughing up clear, yellow, or green mucus.  Wheezing.  Chest congestion.  Shortness of breath.  A fever.  Body aches.  Chills.  A sore throat. Follow these instructions at home: Medicines  Take over-the-counter and prescription medicines only as told by your doctor.  If you were prescribed an antibiotic medicine, take it as told by your doctor. Do not stop taking the antibiotic even if you start to feel better. General instructions  Rest.  Drink enough fluids to keep your pee (urine) clear or pale yellow.  Avoid smoking and secondhand smoke. If you smoke and you need help quitting, ask your doctor. Quitting will help your lungs heal faster.  Use an inhaler, cool mist vaporizer, or humidifier as told by your doctor.  Keep all follow-up visits as told by your doctor. This is important. How is this prevented? To lower your risk of getting this condition again:  Wash your hands often with soap and water. If you cannot use soap and water, use hand sanitizer.  Avoid contact with people who have cold symptoms.  Try not to touch your hands to your mouth, nose, or eyes.  Make sure to get the flu shot every year. Contact a doctor if:  Your symptoms do not get better in 2 weeks. Get help right away if:  You cough up blood.  You  have chest pain.  You have very bad shortness of breath.  You become dehydrated.  You faint (pass out) or keep feeling like you are going to pass out.  You keep throwing up (vomiting).  You have a very bad headache.  Your fever or chills gets worse. This information is not intended to replace advice given to you by your health care provider. Make sure you discuss any questions you have with your health care provider. Document Released: 05/12/2008 Document Revised: 07/02/2016 Document Reviewed: 05/14/2016 Elsevier Interactive Patient Education  2017 [employer]

## 2016-12-18 DIAGNOSIS — Z23 Encounter for immunization: Secondary | ICD-10-CM | POA: Diagnosis not present

## 2017-02-19 ENCOUNTER — Ambulatory Visit (INDEPENDENT_AMBULATORY_CARE_PROVIDER_SITE_OTHER): Payer: BLUE CROSS/BLUE SHIELD | Admitting: Physician Assistant

## 2017-02-19 VITALS — BP 110/72 | HR 106 | Temp 98.4°F | Resp 16 | Ht 63.5 in | Wt 139.0 lb

## 2017-02-19 DIAGNOSIS — F17200 Nicotine dependence, unspecified, uncomplicated: Secondary | ICD-10-CM

## 2017-02-19 DIAGNOSIS — Z131 Encounter for screening for diabetes mellitus: Secondary | ICD-10-CM | POA: Diagnosis not present

## 2017-02-19 DIAGNOSIS — J4531 Mild persistent asthma with (acute) exacerbation: Secondary | ICD-10-CM | POA: Diagnosis not present

## 2017-02-19 DIAGNOSIS — Z1371 Encounter for nonprocreative screening for genetic disease carrier status: Secondary | ICD-10-CM

## 2017-02-19 LAB — GLUCOSE, POCT (MANUAL RESULT ENTRY): POC GLUCOSE: 68 mg/dL — AB (ref 70–99)

## 2017-02-19 MED ORDER — BUPROPION HCL ER (SMOKING DET) 150 MG PO TB12: ORAL_TABLET | ORAL | 3 refills | Status: AC

## 2017-02-19 MED ORDER — ALBUTEROL SULFATE (2.5 MG/3ML) 0.083% IN NEBU
2.5000 mg | INHALATION_SOLUTION | Freq: Once | RESPIRATORY_TRACT | Status: AC
  Administered 2017-02-19: 2.5 mg via RESPIRATORY_TRACT

## 2017-02-19 MED ORDER — PREDNISONE 50 MG PO TABS: ORAL_TABLET | ORAL | 0 refills | Status: DC

## 2017-02-19 MED ORDER — IPRATROPIUM BROMIDE 0.02 % IN SOLN
0.5000 mg | Freq: Once | RESPIRATORY_TRACT | Status: AC
  Administered 2017-02-19: 0.5 mg via RESPIRATORY_TRACT

## 2017-02-19 NOTE — Progress Notes (Signed)
02/19/2017 11:40 AM   DOB: 09/07/1986 / MRN: [id number]  SUBJECTIVE:  Timothy Adkins is a 31 y.o. male with a history of asthma presenting for cough and wheezing that started last night. He associates chest tightness and SOB. He tells me he was just diagnosed with asthma last year year. He tells me that he has been smoking for 5 cigarettes daily for the last 10 years. He tells me he would like to take medication to   He has been living in the [place] for the last ten years and works in a [employer].  Most recent chest rads in late 2017 did show acute smoking related bronchitic changes.   There is no immunization history on file for this patient.  He has No Known Allergies.   He  has a past medical history of Asthma.    He  reports that he quit smoking about 2 years ago. He has a 2.50 pack-year smoking history. He has never used smokeless tobacco. He reports that he drinks alcohol. He reports that he does not use drugs. He  reports that he currently engages in sexual activity. He reports using the following method of birth control/protection: Condom. The patient  has no past surgical history on file.  His family history is not on file.  Review of Systems  Constitutional: Negative for chills and fever.  HENT: Negative for congestion and sore throat.   Cardiovascular: Negative for chest pain and palpitations.  Gastrointestinal: Negative for nausea.  Skin: Negative for itching and rash.  Neurological: Negative for dizziness.    The problem list and medications were reviewed and updated by myself where necessary and exist elsewhere in the encounter.   OBJECTIVE:  BP 110/72 (BP Location: Left Arm, Cuff Size: Normal)   Pulse (!) 106   Temp 98.4 F (36.9 C) (Oral)   Resp 16   Ht 5' 3.5" (1.613 m)   Wt 139 lb (63 kg)   SpO2 96%   PF 450 L/min   BMI 24.24 kg/m   Physical Exam  Constitutional: He is oriented to person, place, and time. He appears well-developed. He does not appear ill.  HENT:   Mouth/Throat: No oropharyngeal exudate.  Eyes: Conjunctivae and EOM are normal. Pupils are equal, round, and reactive to light.  Cardiovascular: Regular rhythm, normal heart sounds and intact distal pulses.   Pulmonary/Chest: Effort normal. No accessory muscle usage. No respiratory distress. He has decreased breath sounds. He has wheezes. He has no rhonchi. He has no rales. He exhibits no tenderness.  Abdominal: He exhibits no distension.  Musculoskeletal: Normal range of motion. He exhibits no edema, tenderness or deformity.  Neurological: He is alert and oriented to person, place, and time. No cranial nerve deficit. Coordination normal.  Skin: Skin is warm and dry. He is not diaphoretic.  Psychiatric: He has a normal mood and affect.  Nursing note and vitals reviewed.   Results for orders placed or performed in visit on 02/19/17 (from the past 72 hour(s))  POCT glucose (manual entry)     Status: Abnormal   Collection Time: 02/19/17  9:21 AM  Result Value Ref Range   POC Glucose 68 (A) 70 - 99 mg/dl    No results found.  ASSESSMENT AND PLAN:  Akio was seen today for asthma and cough.  Diagnoses and all orders for this visit:  Mild persistent asthma with exacerbation -     albuterol (PROVENTIL) (2.5 MG/3ML) 0.083% nebulizer solution 2.5 mg; Take 3 mLs (  2.5 mg total) by nebulization once. -     ipratropium (ATROVENT) nebulizer solution 0.5 mg; Take 2.5 mLs (0.5 mg total) by nebulization once. -     albuterol (PROVENTIL) (2.5 MG/3ML) 0.083% nebulizer solution 2.5 mg; Take 3 mLs (2.5 mg total) by nebulization once. -     predniSONE (DELTASONE) 50 MG tablet; Take one tab daily for three days  Screening for diabetes mellitus -     POCT glucose (manual entry)  Screening for genetic disease carrier status -     Alpha-1 antitrypsin phenotype  Current every day smoker: He would like to quit.  He has tried several other methods and  -     buPROPion (ZYBAN) 150 MG 12 hr tablet; Take one  tablet daily in the morning for one week.  Take one morning and night after one week. Continue for three months.    The patient is advised to call or return to clinic if he does not see an improvement in symptoms, or to seek the care of the closest emergency department if he worsens with the above plan.   Deliah Boston, MHS, PA-C [facility] 02/19/2017 11:40 AM

## 2017-02-19 NOTE — Patient Instructions (Addendum)
You must quit smoking.  I can not stress this enough, as your chest xrays are already showing some changes and you are only [age] years old.   Please make sure your are taking you QVAR as prescribed. This is a daily medication and helps to prevent asthma flares.  Take two puffs in the morning and night and do not miss doses.    You should also be taking your singulair daily.    IF you received an x-ray today, you will receive an invoice from [facility]. Please contact [facility] at [phone] with questions or concerns regarding your invoice.   IF you received labwork today, you will receive an invoice from [facility]. Please contact [facility] at [phone] with questions or concerns regarding your invoice.   Our billing staff will not be able to assist you with questions regarding bills from these companies.  You will be contacted with the lab results as soon as they are available. The fastest way to get your results is to activate your My Chart account. Instructions are located on the last page of this paperwork. If you have not heard from us regarding the results in 2 weeks, please contact this office.

## 2017-02-23 LAB — ALPHA-1 ANTITRYPSIN PHENOTYPE: A-1 Antitrypsin: 129 mg/dL (ref 90–200)

## 2017-02-26 ENCOUNTER — Ambulatory Visit (INDEPENDENT_AMBULATORY_CARE_PROVIDER_SITE_OTHER): Payer: BLUE CROSS/BLUE SHIELD | Admitting: Physician Assistant

## 2017-02-26 DIAGNOSIS — J4540 Moderate persistent asthma, uncomplicated: Secondary | ICD-10-CM | POA: Diagnosis not present

## 2017-02-26 MED ORDER — IPRATROPIUM BROMIDE 0.02 % IN SOLN
0.5000 mg | Freq: Once | RESPIRATORY_TRACT | Status: AC
  Administered 2017-02-26: 0.5 mg via RESPIRATORY_TRACT

## 2017-02-26 MED ORDER — FLUTICASONE-SALMETEROL 250-50 MCG/DOSE IN AEPB: 1.0000 | INHALATION_SPRAY | Freq: Two times a day (BID) | RESPIRATORY_TRACT | 3 refills | Status: DC

## 2017-02-26 MED ORDER — ALBUTEROL SULFATE (2.5 MG/3ML) 0.083% IN NEBU
2.5000 mg | INHALATION_SOLUTION | Freq: Once | RESPIRATORY_TRACT | Status: AC
  Administered 2017-02-26: 2.5 mg via RESPIRATORY_TRACT

## 2017-02-26 NOTE — Patient Instructions (Signed)
     IF you received an x-ray today, you will receive an invoice from [facility]. Please contact [facility] at [phone] with questions or concerns regarding your invoice.   IF you received labwork today, you will receive an invoice from [facility]. Please contact [facility] at [phone] with questions or concerns regarding your invoice.   Our billing staff will not be able to assist you with questions regarding bills from these companies.  You will be contacted with the lab results as soon as they are available. The fastest way to get your results is to activate your My Chart account. Instructions are located on the last page of this paperwork. If you have not heard from us regarding the results in 2 weeks, please contact this office.     

## 2017-02-26 NOTE — Progress Notes (Signed)
  03/01/2017 4:12 PM   DOB: 12/11/1985 / MRN: [id number]  SUBJECTIVE:  Timothy Adkins is a 31 y.o. male presenting for recheck of asthma.  Tells me he felt better after taking course of prednisone, however wheezing returned after completing the course. HE HAS STOPPED SMOKING since I last saw him.  He has been taking his QVAR 80 mcg up to 5 times daily.  He is also using his albuterol with mild relief.  Feels that his medication is not strong enough. He is also taking montelukast. Given his young age and requent exacerbations I screened him for Alpha 1 antitrypsan deficiency and he was fortunately negative for this.   He has No Known Allergies.   He  has a past medical history of Asthma.    He  reports that he quit smoking about 2 years ago. He has a 2.50 pack-year smoking history. He has never used smokeless tobacco. He reports that he drinks alcohol. He reports that he does not use drugs. He  reports that he currently engages in sexual activity. He reports using the following method of birth control/protection: Condom. The patient  has no past surgical history on file.  His family history is not on file.  Review of Systems  Constitutional: Negative for fever.  Respiratory: Positive for cough, shortness of breath and wheezing. Negative for hemoptysis and sputum production.   Cardiovascular: Negative for chest pain and leg swelling.  Gastrointestinal: Negative for nausea.  Skin: Negative for rash.    The problem list and medications were reviewed and updated by myself where necessary and exist elsewhere in the encounter.   OBJECTIVE:  BP 138/76   Pulse 89   Temp 98.6 F (37 C) (Oral)   Resp 18   Ht 5' 3.5" (1.613 m)   Wt 145 lb 12.8 oz (66.1 kg)   SpO2 97%   BMI 25.42 kg/m   Physical Exam  Constitutional: He is oriented to person, place, and time.  Pulmonary/Chest: No respiratory distress. He has wheezes (resolved with one duo neb). He has no rales. He exhibits no tenderness.   Neurological: He is alert and oriented to person, place, and time.    No results found for this or any previous visit (from the past 72 hour(s)).  No results found.  ASSESSMENT AND PLAN:  Timothy Adkins was seen today for asthma and medication refill.  Diagnoses and all orders for this visit:  Moderate persistent asthma without complication: SABA, LABA, with ICS along with leukotriene inhibitor. If his problems persist he will need to see pulm.  -     albuterol (PROVENTIL) (2.5 MG/3ML) 0.083% nebulizer solution 2.5 mg; Take 3 mLs (2.5 mg total) by nebulization once. -     ipratropium (ATROVENT) nebulizer solution 0.5 mg; Take 2.5 mLs (0.5 mg total) by nebulization once. -     Fluticasone-Salmeterol (ADVAIR DISKUS) 250-50 MCG/DOSE AEPB; Inhale 1 puff into the lungs 2 (two) times daily.  Other orders -     Cancel: Fluticasone-Salmeterol (ADVAIR) 100-50 MCG/DOSE AEPB; Inhale 1 puff into the lungs 2 (two) times daily.    The patient is advised to call or return to clinic if he does not see an improvement in symptoms, or to seek the care of the closest emergency department if he worsens with the above plan.   Deliah BostonMichael Howie Adkins, MHS, PA-C [facility] 03/01/2017 4:12 PM

## 2017-03-16 ENCOUNTER — Ambulatory Visit (INDEPENDENT_AMBULATORY_CARE_PROVIDER_SITE_OTHER): Payer: BLUE CROSS/BLUE SHIELD | Admitting: Family Medicine

## 2017-03-16 VITALS — BP 116/72 | HR 90 | Temp 98.1°F | Resp 18 | Ht 63.5 in | Wt 144.0 lb

## 2017-03-16 DIAGNOSIS — J4540 Moderate persistent asthma, uncomplicated: Secondary | ICD-10-CM | POA: Diagnosis not present

## 2017-03-16 MED ORDER — BUDESONIDE-FORMOTEROL FUMARATE 80-4.5 MCG/ACT IN AERO: 2.0000 | INHALATION_SPRAY | Freq: Two times a day (BID) | RESPIRATORY_TRACT | 2 refills | Status: DC

## 2017-03-16 MED ORDER — ALBUTEROL SULFATE HFA 108 (90 BASE) MCG/ACT IN AERS: 2.0000 | INHALATION_SPRAY | RESPIRATORY_TRACT | 0 refills | Status: DC | PRN

## 2017-03-16 NOTE — Patient Instructions (Addendum)
Because you ran out early, pharmacy will not be able to fill in new prescription for Advair unless you pay for it out of pocket. Instead we can change to Symbicort, 2 puffs twice per day. That is a maintenance medicine and should only be used 2 puffs twice per day.   If needed for breakthrough wheezing, can use albuterol inhaler up to every 4-6 hours. Recheck in the next 2 weeks, sooner if you have to use albuterol more frequently. Continue singular once per day.  Return to the clinic or go to the nearest emergency room if any of your symptoms worsen or new symptoms occur.    Hen suy?n, Ng??i l?n (Asthma, Adult) Hen suy?n l m?t tnh tr?ng b?nh l ti pht trong ?, ???ng h h?p b? th?t ch?t v h?p l?i. Hen suy?n c th? gy kh th?. N c th? gy ho, th? kh kh, v kh th? Cc ??t hen, hay cn g?i l cc c?n hen, c th? ? m?c ?? t? nh? ??n nguy hi?m ??n tnh m?ng. Khng th? ch?a kh?i ???c hen suy?n, nh?ng thu?c v thay ??i l?i s?ng c th? gip ki?m sot b?nh ny. NGUYN NHN Hen ???c cho l do cc y?u t? ???c th?a h??ng t? th? h? tr??c (di truy?n) v mi tr??ng, nh?ng nguyn nhn chnh xc v?n ch?a ???c xc ??nh. Hen c th? b? kh?i pht b?i cc d? ?ng nguyn, nhi?m trng ph?i, ho?c cc ch?t kch thch trong khng kh. Tc nhn gy hen suy?n khc nhau v?i m?i ng??i. Tc nhn ph? bi?n gy kh?i pht b?nh bao g?m:  V?y da ??ng v?t.  M?t b?i nh.  Gin.  Ph?n hoa c?a cy ho?c c?.  N?m m?c.  Khi thu?c.  Cc ch?t  nhi?m khng kh nh? b?i, ch?t t?y r?a ?? gia d?ng, ch?t x?t tc, ch?t phun x?t, h?i s?n, ha ch?t m?nh ho?c mi m?nh.  Khng kh l?nh, thay ??i th?i ti?t, v gi (lm t?ng l??ng n?m m?c v ph?n hoa trong khng kh).  Bi?u l? c?m xc m?nh nh? khc ho?c c??i nhi?u.  C?ng th?ng.  M?t s? lo?i thu?c nh?t ??nh (nh? aspirin) ho?c cc lo?i thu?c (nh? thu?c ch?n b ta).  Ch?t sunfit trong th?c ?n v ?? u?ng. Th?c ?n v ?? u?ng c th? c ch?t sunfit, bao g?m tri cy kh, khoai ty  chin, v n??c nho c ga.  Cc tnh tr?ng nhi?m trng ho?c vim nh? cm, c?m l?nh ho?c vim nim m?c m?i (vim m?i ).  B?nh tro ng??c d? dy th?c qu?n (GERD).  T?p luy?n ho?c ho?t ??ng c?ng th?ng. TRI?U CH?NG Cc tri?u ch?ng c th? di?n ra ngay sau khi hen suy?n kh?i pht ho?c nhi?u gi? sau ?. Cc tri?u ch?ng bao g?m:  Th? kh kh.  Ho nhi?u vo ban ?m ho?c bu?i sng s?m.  Ho th??ng xuyn ho?c ho d? d?i khi b? c?m l?nh thng th??ng.  T?c ng?c.  Kh th?. CH?N ?ON Ch?n ?on hen suy?n ???c th?c hi?n b?ng cch xem xt b?nh s? c?a qu v? v khm th?c th?. C?ng c th? ph?i lm cc ph?n ki?m tra. Nh?ng ki?m tra ny c th? bao g?m:  Nghin c?u ch?ng n?ng ph?i. Ki?m tra ny cho bi?t qu v? ht vo v th? ra ???c bao nhiu khng kh.  Ki?m tra d? ?ng.  Ki?m tra b?ng hnh ?nh nh? ch?p X quang. ?I?U TR? Hen suy?n khng th? ch?a kh?i ???c, nh?ng th??ng c  th? ki?m sot ???c. ?i?u tr? bao g?m vi?c xc ??nh v phng trnh cc tc nhn gy kh?i pht hen suy?n cho qu v?. ?i?u tr? c?ng c th? bao g?m c? thu?c. C 2 lo?i thu?c ???c s? d?ng ?? ?i?u tr? hen suy?n:  Cc thu?c ki?m sot. Cc thu?c ny ng?n ng?a khng cho cc tri?u ch?ng hen suy?n x?y ra. Thu?c th??ng ???c dng m?i ngy.  Thu?c lm gi?m nh? ho?c c?p c?u. Nh?ng thu?c ny nhanh chng lm gi?m cc tri?u ch?ng hen suy?n. Cc thu?c ? th??ng ???c dng khi c?n thi?t v ?? gi?m nh? tri?u ch?ng trong th?i gian ng?n. Chuyn gia ch?m [place] s?c kh?e s? gip qu v? xy d?ng m?t k? ho?ch th?c hi?n vi?c ki?m sot hen suy?n. M?t k? ho?ch th?c hi?n vi?c ki?m sot hen suy?n l m?t k? ho?ch ???c ??a ra ?? qu?n l v ?i?u tr? cc c?n hen suy?n c?a qu v?. N bao g?m m?t danh sch cc tc nhn gy kh?i pht hen suy?n v cch c th? phng trnh [place]. K? ho?ch ? c?ng bao g?m thng tin v? vi?c khi no c?n dng thu?c v khi no c?n thay ??i li?u thu?c. M?t k? ho?ch th?c hi?n c?ng c th? bao g?m vi?c s? d?ng m?t d?ng c? g?i l l?u l??ng ??nh k?. L?u l??ng ??nh  k? ?o kh? n?ng ho?t ??ng c?a ph?i. N gip gim st tnh tr?ng c?a qu v?. H??NG D?N CH?M [place] T?I NH  Ch? s? d?ng thu?c theo ch? d?n c?a chuyn gia ch?m [place] s?c kh?e. Hy ni v?i chuyn gia ch?m [place] s?c kh?e n?u qu v? c cc cu h?i v? cch th?c v th?i gian dng thu?c.  S? d?ng l?u l??ng ??nh k? theo ch? d?n c?a chuyn gia ch?m [place] s?c kh?e. Ghi l?i v theo di cc k?t qu? ?o.  Hi?u v s? d?ng k? ho?ch th?c hi?n ?? gip gi?m thi?u ho?c ng?n ch?n c?n hen m khng c?n ph?i ?i khm.  Ki?m sot mi tr??ng trong nh c?a qu v? theo nh?ng cch sau ?? gip ng?n ch?n c?n hen:  Khng ht thu?c. [place] b? ti?p xc v?i khi thu?c th? ??ng.  Thay b? l?c l s??i v ?i?u ho khng kh th??ng xuyn.  H?n ch? s? d?ng l s??i v b?p c?i.  Di?t v?t gy h?i (ch?ng h?n nh? gin v chu?t) v lo?i b? phn c?a chng.  V?t b? cy n?u qu v? th?y chng b? n?m m?c.  Th??ng xuyn lm s?ch sn nh v b?i b?n. S? d?ng s?n ph?m lm s?ch khng mi.  C? g?ng nh? m?t ng??i khc th??ng xuyn ht b?i cho qu v?. Khng vo cc phng trong lc ?ang ht b?i v m?t lc sau ?Marland Kitchen N?u qu v? ht b?i, hy s? d?ng m?t m?t n? ch?ng b?i mua ? c?a hng ?? dng trong nh, m?t ti ch?a b?i hai l?p ho?c ti c b? l?c trong my ht b?i, ho?c m?t my ht b?i c b? l?c HEPA.  Thay th? th?m b?ng sn g?, ? lt ho?c nh?a vinyl. Th?m c th? dnh lng v b?i.  S? d?ng g?i, ga tr?i gi??ng v ga tr?i n?m l xo ch?ng d? ?ng.  Gi?t kh?n tr?i gi??ng v ch?n hng tu?n b?ng n??c nng v dng my s?y s?y kh.  S? d?ng ch?n lm b?ng polyester ho?c v?i bng.  Lm s?ch nh v? sinh v b?p b?ng ch?t t?y. N?u c th?, hy s?n l?i  t??ng trong cc phng ny b?ng lo?i s?n ch?ng m?c. Trnh xa cc phng ?ang ???c lm s?ch v ?ang ???c s?n.  R?a tay th??ng xuyn. ?I KHM N?U:  Qu v? th? kh kh, kh th? ho?c ho ngay c? khi ? dng thu?c ?? ng?n ch?n c?n hen.  Ch?t nhy c mu m qu v? ho ra (??m) ??c h?n bnh th??ng.  ??m c?a qu v? thay ??i t?  khng mu (trong) ho?c mu tr?ng sang mu vng, xanh l cy, xm ho?c c mu.  Qu v? c b?t k? v?n ?? no lin quan ??n thu?c ?ang s? d?ng (ch?ng h?n nh? pht ban, ng?a, s?ng ho?c kh th?).  Qu v? s? d?ng thu?c lm gi?m hen suy?n nhi?u h?n 2-3 l?n m?i tu?n.  L?u l??ng ??nh c?a qu v? v?n ? kho?ng 50-79% ch? s? c nhn t?t nh?t sau khi lm theo k? ho?ch th?c hi?n c?a qu v? trong 1 ti?ng.  Qu v? b? s?t. NGAY L?P T?C ?I KHM N?U:  Qu v? d??ng nh? b? n?ng h?n v khng ?p ?ng v?i vi?c ?i?u tr? trong c?n hen.  Qu v? kh th? ngay c? khi ngh?Ladell Heads v? kh th? khi th?c hi?n ho?t ??ng th? ch?t r?t nh?Ladell Heads v? g?p kh kh?n khi ?n, u?ng ho?c ni chuy?n do cc tri?u ch?ng hen suy?n.  Qu v? b? ?au ng?c.  Qu v? c nh?p tim nhanh.  Mi ho?c mng tay c?a qu v? c mu xanh nh?t.  Qu v? b? chong vng, chng m?t ho?c ng?t.  L?u l??ng ??nh c?a qu v? d??i 50% ch? s? c nhn t?t nh?t. Thng tin ny khng nh?m m?c ?ch thay th? cho l?i khuyn m chuyn gia ch?m [place] s?c kh?e ni v?i qu v?. Hy b?o ??m qu v? ph?i th?o lu?n b?t k? v?n ?? g m qu v? c v?i chuyn gia ch?m [place] s?c kh?e c?a qu v?. Document Released: 11/24/2005 Document Revised: 08/15/2015 Document Reviewed: 06/23/2013 Elsevier Interactive Patient Education  2017 Elsevier Inc.   IF you received an x-ray today, you will receive an invoice from [facility]. Please contact [facility] at [phone] with questions or concerns regarding your invoice.   IF you received labwork today, you will receive an invoice from [facility]. Please contact [facility] at [phone] with questions or concerns regarding your invoice.   Our billing staff will not be able to assist you with questions regarding bills from these companies.  You will be contacted with the lab results as soon as they are available. The fastest way to get your results is to activate your My Chart account. Instructions are located on the last page of  this paperwork. If you have not heard from [place] regarding the results in 2 weeks, please contact this office.

## 2017-03-16 NOTE — Progress Notes (Signed)
By signing my name below, I, Timothy Adkins, attest that this documentation has been prepared under the direction and in the presence of Timothy Staggers, MD.  Electronically Signed: Arvilla Market, Medical Scribe. 03/16/17. 4:31 PM.  Subjective:    Patient ID: Timothy Adkins, male    DOB: Sep 12, 1986, 31 y.o.   MRN: [id number]  HPI Chief Complaint  Patient presents with  . Medication Refill    Advair    HPI Comments: Timothy Adkins is a 31 y.o. male who presents to the Primary Care at [facility] and [facility] for asthma follow-up. Was seen multiple times in March; last visit 02/26/17. Had stopped smoking at that time, but was taking qvar up to 5x a day, also taking singular. He was treated with nebulizer treatment and started on advair 250/50 mcg BID, and continued on albuterol PRN. Pt's native language is [place] so stratus interpreter [phone] was used.  Pt uses advair 2-3x a day - he uses it for the 3rd time when he works out. He also uses singular QD, but rare albuterol. with relief of his sxs. Pt ran out of advair yesterday and would like to get a refill. He has continued to avoid tobacco use.  Patient Active Problem List   Diagnosis Date Noted  . Asthmatic bronchitis 12/02/2016    Class: Acute   Past Medical History:  Diagnosis Date  . Asthma    History reviewed. No pertinent surgical history. No Known Allergies Prior to Admission medications   Medication Sig Start Date End Date Taking? Authorizing Provider  albuterol (PROVENTIL HFA;VENTOLIN HFA) 108 (90 Base) MCG/ACT inhaler Inhale 2 puffs into the lungs every 4 (four) hours as needed for wheezing or shortness of breath (cough, shortness of breath or wheezing.). 02/07/16  Yes Peyton Najjar, MD  buPROPion (ZYBAN) 150 MG 12 hr tablet Take one tablet daily in the morning for one week.  Take one morning and night after one week. Continue for three months. 02/19/17  Yes Ofilia Neas, PA-C  fluticasone (FLONASE) 50 MCG/ACT nasal  spray Place 2 sprays into both nostrils daily. 06/29/14  Yes Ozella Rocks, MD  Fluticasone-Salmeterol (ADVAIR DISKUS) 250-50 MCG/DOSE AEPB Inhale 1 puff into the lungs 2 (two) times daily. 02/26/17  Yes Ofilia Neas, PA-C  montelukast (SINGULAIR) 10 MG tablet Take 1 tablet (10 mg total) by mouth at bedtime. 11/11/16  Yes Wallis Bamberg, PA-C   Social History   Social History  . Marital status: Single    Spouse name: Timothy Adkins  . Number of children: Timothy Adkins  . Years of education: Timothy Adkins   Occupational History  . Not on file.   Social History Main Topics  . Smoking status: Former Smoker    Packs/day: 0.25    Years: 10.00    Quit date: 12/02/2014  . Smokeless tobacco: Never Used  . Alcohol use Yes  . Drug use: No  . Sexual activity: Yes    Birth control/ protection: Condom   Other Topics Concern  . Not on file   Social History Narrative  . No narrative on file   Review of Systems  Respiratory: Positive for shortness of breath and wheezing.    Objective:  Physical Exam  Constitutional: He appears well-developed and well-nourished. No distress.  HENT:  Head: Normocephalic and atraumatic.  Eyes: Conjunctivae are normal.  Neck: Neck supple.  Cardiovascular: Normal rate, regular rhythm and normal heart sounds.  Exam reveals no gallop and no friction rub.  No murmur heard. Pulmonary/Chest: Effort normal. No respiratory distress. He has wheezes (faint diffuse). He has no rales.  Neurological: He is alert.  Skin: Skin is warm and dry.  Psychiatric: He has a normal mood and affect. His behavior is normal.  Nursing note and vitals reviewed.   Vitals:   03/16/17 1606  BP: 116/72  Pulse: 90  Resp: 18  Temp: 98.1 F (36.7 C)  TempSrc: Oral  SpO2: 95%  Weight: 144 lb (65.3 kg)  Height: 5' 3.5" (1.613 m)  Body mass index is 25.11 kg/m. Assessment & Plan:    Timothy Adkins is a 31 y.o. male Moderate persistent asthma, unspecified whether complicated - Plan: budesonide-formoterol  (SYMBICORT) 80-4.5 MCG/ACT inhaler  Moderate persistent asthma without complication  -persistent asthma, had been improving with Advair, but some confusion on med dosing.   - change to symbicort as Advair would be out of pocket as early refill. Voucher given. BID dosing as maintenance discussed.   - albuterol HFA if needed prior to workout and prn dosing discussed, rtc precautions if persistent/frequent need.   -continue singulair QD.   - recheck next few weeks to assess control on this regimen.    Meds ordered this encounter  Medications  . budesonide-formoterol (SYMBICORT) 80-4.5 MCG/ACT inhaler    Sig: Inhale 2 puffs into the lungs 2 (two) times daily.    Dispense:  1 Inhaler    Refill:  2  . albuterol (PROVENTIL HFA;VENTOLIN HFA) 108 (90 Base) MCG/ACT inhaler    Sig: Inhale 2 puffs into the lungs every 4 (four) hours as needed for wheezing or shortness of breath (cough, shortness of breath or wheezing.).    Dispense:  1 Inhaler    Refill:  0   Patient Instructions    Because you ran out early, pharmacy will not be able to fill in new prescription for Advair unless you pay for it out of pocket. Instead we can change to Symbicort, 2 puffs twice per day. That is a maintenance medicine and should only be used 2 puffs twice per day.   If needed for breakthrough wheezing, can use albuterol inhaler up to every 4-6 hours. Recheck in the next 2 weeks, sooner if you have to use albuterol more frequently. Continue singular once per day.  Return to the clinic or go to the nearest emergency room if any of your symptoms worsen or new symptoms occur.    Hen suy?n, Ng??i l?n (Asthma, Adult) Hen suy?n l m?t tnh tr?ng b?nh l ti pht trong ?, ???ng h h?p b? th?t ch?t v h?p l?i. Hen suy?n c th? gy kh th?. N c th? gy ho, th? kh kh, v kh th? Cc ??t hen, hay cn g?i l cc c?n hen, c th? ? m?c ?? t? nh? ??n nguy hi?m ??n tnh m?ng. Khng th? ch?a kh?i ???c hen suy?n, nh?ng thu?c v  thay ??i l?i s?ng c th? gip ki?m sot b?nh ny. NGUYN NHN Hen ???c cho l do cc y?u t? ???c th?a h??ng t? th? h? tr??c (di truy?n) v mi tr??ng, nh?ng nguyn nhn chnh xc v?n ch?a ???c xc ??nh. Hen c th? b? kh?i pht b?i cc d? ?ng nguyn, nhi?m trng ph?i, ho?c cc ch?t kch thch trong khng kh. Tc nhn gy hen suy?n khc nhau v?i m?i ng??i. Tc nhn ph? bi?n gy kh?i pht b?nh bao g?m:  V?y da ??ng v?t.  M?t b?i nh.  Gin.  Ph?n hoa c?a cy ho?c c?.  N?m m?c.  Khi thu?c.  Cc ch?t  nhi?m khng kh nh? b?i, ch?t t?y r?a ?? gia d?ng, ch?t x?t tc, ch?t phun x?t, h?i s?n, ha ch?t m?nh ho?c mi m?nh.  Khng kh l?nh, thay ??i th?i ti?t, v gi (lm t?ng l??ng n?m m?c v ph?n hoa trong khng kh).  Bi?u l? c?m xc m?nh nh? khc ho?c c??i nhi?u.  C?ng th?ng.  M?t s? lo?i thu?c nh?t ??nh (nh? aspirin) ho?c cc lo?i thu?c (nh? thu?c ch?n b ta).  Ch?t sunfit trong th?c ?n v ?? u?ng. Th?c ?n v ?? u?ng c th? c ch?t sunfit, bao g?m tri cy kh, khoai ty chin, v n??c nho c ga.  Cc tnh tr?ng nhi?m trng ho?c vim nh? cm, c?m l?nh ho?c vim nim m?c m?i (vim m?i ).  B?nh tro ng??c d? dy th?c qu?n (GERD).  T?p luy?n ho?c ho?t ??ng c?ng th?ng. TRI?U CH?NG Cc tri?u ch?ng c th? di?n ra ngay sau khi hen suy?n kh?i pht ho?c nhi?u gi? sau ?. Cc tri?u ch?ng bao g?m:  Th? kh kh.  Ho nhi?u vo ban ?m ho?c bu?i sng s?m.  Ho th??ng xuyn ho?c ho d? d?i khi b? c?m l?nh thng th??ng.  T?c ng?c.  Kh th?. CH?N ?ON Ch?n ?on hen suy?n ???c th?c hi?n b?ng cch xem xt b?nh s? c?a qu v? v khm th?c th?. C?ng c th? ph?i lm cc ph?n ki?m tra. Nh?ng ki?m tra ny c th? bao g?m:  Nghin c?u ch?ng n?ng ph?i. Ki?m tra ny cho bi?t qu v? ht vo v th? ra ???c bao nhiu khng kh.  Ki?m tra d? ?ng.  Ki?m tra b?ng hnh ?nh nh? ch?p X quang. ?I?U TR? Hen suy?n khng th? ch?a kh?i ???c, nh?ng th??ng c th? ki?m sot ???c. ?i?u tr? bao g?m vi?c xc ??nh v  phng trnh cc tc nhn gy kh?i pht hen suy?n cho qu v?. ?i?u tr? c?ng c th? bao g?m c? thu?c. C 2 lo?i thu?c ???c s? d?ng ?? ?i?u tr? hen suy?n:  Cc thu?c ki?m sot. Cc thu?c ny ng?n ng?a khng cho cc tri?u ch?ng hen suy?n x?y ra. Thu?c th??ng ???c dng m?i ngy.  Thu?c lm gi?m nh? ho?c c?p c?u. Nh?ng thu?c ny nhanh chng lm gi?m cc tri?u ch?ng hen suy?n. Cc thu?c ? th??ng ???c dng khi c?n thi?t v ?? gi?m nh? tri?u ch?ng trong th?i gian ng?n. Chuyn gia ch?m [place] s?c kh?e s? gip qu v? xy d?ng m?t k? ho?ch th?c hi?n vi?c ki?m sot hen suy?n. M?t k? ho?ch th?c hi?n vi?c ki?m sot hen suy?n l m?t k? ho?ch ???c ??a ra ?? qu?n l v ?i?u tr? cc c?n hen suy?n c?a qu v?. N bao g?m m?t danh sch cc tc nhn gy kh?i pht hen suy?n v cch c th? phng trnh [place]. K? ho?ch ? c?ng bao g?m thng tin v? vi?c khi no c?n dng thu?c v khi no c?n thay ??i li?u thu?c. M?t k? ho?ch th?c hi?n c?ng c th? bao g?m vi?c s? d?ng m?t d?ng c? g?i l l?u l??ng ??nh k?. L?u l??ng ??nh k? ?o kh? n?ng ho?t ??ng c?a ph?i. N gip gim st tnh tr?ng c?a qu v?. H??NG D?N CH?M [place] T?I NH  Ch? s? d?ng thu?c theo ch? d?n c?a chuyn gia ch?m [place] s?c kh?e. Hy ni v?i chuyn gia ch?m  s?c kh?e n?u qu v? c cc cu h?i v? cch th?c v th?i gian dng thu?c.  S? d?ng l?u l??ng ??nh k? theo ch? d?n c?a chuyn gia ch?m [place] s?c kh?e. Ghi l?i v theo di cc k?t qu? ?o.  Hi?u v s? d?ng k? ho?ch th?c hi?n ?? gip gi?m thi?u ho?c ng?n ch?n c?n hen m khng c?n ph?i ?i khm.  Ki?m sot mi tr??ng trong nh c?a qu v? theo nh?ng cch sau ?? gip ng?n ch?n c?n hen:  Khng ht thu?c. [place] b? ti?p xc v?i khi thu?c th? ??ng.  Thay b? l?c l s??i v ?i?u ho khng kh th??ng xuyn.  H?n ch? s? d?ng l s??i v b?p c?i.  Di?t v?t gy h?i (ch?ng h?n nh? gin v chu?t) v lo?i b? phn c?a chng.  V?t b? cy n?u qu v? th?y chng b? n?m m?c.  Th??ng xuyn lm s?ch sn nh v b?i b?n. S? d?ng s?n ph?m lm  s?ch khng mi.  C? g?ng nh? m?t ng??i khc th??ng xuyn ht b?i cho qu v?. Khng vo cc phng trong lc ?ang ht b?i v m?t lc sau ?Marland Kitchen N?u qu v? ht b?i, hy s? d?ng m?t m?t n? ch?ng b?i mua ? c?a hng ?? dng trong nh, m?t ti ch?a b?i hai l?p ho?c ti c b? l?c trong my ht b?i, ho?c m?t my ht b?i c b? l?c HEPA.  Thay th? th?m b?ng sn g?, ? lt ho?c nh?a vinyl. Th?m c th? dnh lng v b?i.  S? d?ng g?i, ga tr?i gi??ng v ga tr?i n?m l xo ch?ng d? ?ng.  Gi?t kh?n tr?i gi??ng v ch?n hng tu?n b?ng n??c nng v dng my s?y s?y kh.  S? d?ng ch?n lm b?ng polyester ho?c v?i bng.  Lm s?ch nh v? sinh v b?p b?ng ch?t t?y. N?u c th?, hy s?n l?i t??ng trong cc phng ny b?ng lo?i s?n ch?ng m?c. Trnh xa cc phng ?ang ???c lm s?ch v ?ang ???c s?n.  R?a tay th??ng xuyn. ?I KHM N?U:  Qu v? th? kh kh, kh th? ho?c ho ngay c? khi ? dng thu?c ?? ng?n ch?n c?n hen.  Ch?t nhy c mu m qu v? ho ra (??m) ??c h?n bnh th??ng.  ??m c?a qu v? thay ??i t? khng mu (trong) ho?c mu tr?ng sang mu vng, xanh l cy, xm ho?c c mu.  Qu v? c b?t k? v?n ?? no lin quan ??n thu?c ?ang s? d?ng (ch?ng h?n nh? pht ban, ng?a, s?ng ho?c kh th?).  Qu v? s? d?ng thu?c lm gi?m hen suy?n nhi?u h?n 2-3 l?n m?i tu?n.  L?u l??ng ??nh c?a qu v? v?n ? kho?ng 50-79% ch? s? c nhn t?t nh?t sau khi lm theo k? ho?ch th?c hi?n c?a qu v? trong 1 ti?ng.  Qu v? b? s?t. NGAY L?P T?C ?I KHM N?U:  Qu v? d??ng nh? b? n?ng h?n v khng ?p ?ng v?i vi?c ?i?u tr? trong c?n hen.  Qu v? kh th? ngay c? khi ngh?Ladell Heads v? kh th? khi th?c hi?n ho?t ??ng th? ch?t r?t nh?Ladell Heads v? g?p kh kh?n khi ?n, u?ng ho?c ni chuy?n do cc tri?u ch?ng hen suy?n.  Qu v? b? ?au ng?c.  Qu v? c nh?p tim nhanh.  Mi ho?c mng tay c?a qu v? c mu xanh nh?t.  Qu v? b? chong vng, chng m?t ho?c ng?t.  L?u l??ng ??nh c?a qu v? d??i 50% ch? s? c nhn t?t nh?t. Thng tin ny khng nh?m  m?c ?ch thay th?  cho l?i khuyn m chuyn gia ch?m [place] s?c kh?e ni v?i qu v?. Hy b?o ??m qu v? ph?i th?o lu?n b?t k? v?n ?? g m qu v? c v?i chuyn gia ch?m [place] s?c kh?e c?a qu v?. Document Released: 11/24/2005 Document Revised: 08/15/2015 Document Reviewed: 06/23/2013 Elsevier Interactive Patient Education  2017 [employer]   IF you received an x-ray today, you will receive an invoice from [facility]. Please contact [facility] at [phone] with questions or concerns regarding your invoice.   IF you received labwork today, you will receive an invoice from [facility]. Please contact [facility] at [phone] with questions or concerns regarding your invoice.   Our billing staff will not be able to assist you with questions regarding bills from these companies.  You will be contacted with the lab results as soon as they are available. The fastest way to get your results is to activate your My Chart account. Instructions are located on the last page of this paperwork. If you have not heard from [place] regarding the results in 2 weeks, please contact this office.       I personally performed the services described in this documentation, which was scribed in my presence. The recorded information has been reviewed and considered for accuracy and completeness, addended by me as needed, and agree with information above.  Signed,   Timothy Staggers, MD [facility] at [facility].  03/16/17 8:15 PM

## 2017-03-26 ENCOUNTER — Ambulatory Visit: Payer: BLUE CROSS/BLUE SHIELD | Admitting: Family Medicine

## 2017-04-08 ENCOUNTER — Other Ambulatory Visit: Payer: Self-pay | Admitting: Family Medicine

## 2017-04-27 ENCOUNTER — Encounter: Payer: Self-pay | Admitting: Physician Assistant

## 2017-04-27 ENCOUNTER — Ambulatory Visit (INDEPENDENT_AMBULATORY_CARE_PROVIDER_SITE_OTHER): Payer: BLUE CROSS/BLUE SHIELD | Admitting: Physician Assistant

## 2017-04-27 DIAGNOSIS — J4540 Moderate persistent asthma, uncomplicated: Secondary | ICD-10-CM

## 2017-04-27 MED ORDER — BUDESONIDE-FORMOTEROL FUMARATE 160-4.5 MCG/ACT IN AERO: 2.0000 | INHALATION_SPRAY | Freq: Two times a day (BID) | RESPIRATORY_TRACT | 12 refills | Status: DC

## 2017-04-27 NOTE — Progress Notes (Signed)
  04/27/2017 2:42 PM   DOB: 12/31/1985 / MRN: [id number]  SUBJECTIVE:  Timothy Adkins is a 31 y.o. male presenting for asthma recheck. He has been using his Symbicort as prescribed and continues to require TID rescue inhaler. He is, again, requesting stronger medication today. He is willing to see a specialist for further management of his asthma.  He denies chest pain, cough, SOB.  He does have night time cough and this continues, although it is somewhat improved.   He has No Known Allergies.   He  has a past medical history of Asthma.    He  reports that he quit smoking about 2 years ago. He has a 2.50 pack-year smoking history. He has never used smokeless tobacco. He reports that he drinks alcohol. He reports that he does not use drugs. He  reports that he currently engages in sexual activity. He reports using the following method of birth control/protection: Condom. The patient  has no past surgical history on file.  His family history is not on file.  Review of Systems  Constitutional: Negative for chills, diaphoresis and fever.  Respiratory: Negative for cough, hemoptysis, sputum production, shortness of breath and wheezing.   Cardiovascular: Negative for chest pain, orthopnea and leg swelling.  Gastrointestinal: Negative for nausea.  Skin: Negative for rash.  Neurological: Negative for dizziness.    The problem list and medications were reviewed and updated by myself where necessary and exist elsewhere in the encounter.   OBJECTIVE:  BP 116/70 (BP Location: Right Arm, Patient Position: Sitting, Cuff Size: Large)   Pulse 85   Temp 98.2 F (36.8 C) (Oral)   Resp 16   Ht 5' 2.5" (1.588 m)   Wt 143 lb (64.9 kg)   SpO2 97%   BMI 25.74 kg/m   Physical Exam  Constitutional: He appears well-developed. He is active and cooperative.  Non-toxic appearance.  Cardiovascular: Normal rate, regular rhythm, S1 normal, S2 normal, normal heart sounds, intact distal pulses and normal pulses.   Exam reveals no gallop and no friction rub.   No murmur heard. Pulmonary/Chest: Effort normal. No stridor. No tachypnea. No respiratory distress. He has no wheezes. He has no rales.  Abdominal: He exhibits no distension.  Musculoskeletal: He exhibits no edema.  Neurological: He is alert.  Skin: Skin is warm and dry. He is not diaphoretic. No pallor.  Vitals reviewed.   No results found for this or any previous visit (from the past 72 hour(s)).  No results found.  ASSESSMENT AND PLAN:  Timothy Adkins was seen today for follow-up.  Diagnoses and all orders for this visit:  Moderate persistent asthma, unspecified whether complicated: He needs to see pulmonology as he continues to require a rescue inhaler three times daily.  Fortunately he has stopped smoking.  I do wonder if he may have an A1AT phenotype.  -     budesonide-formoterol (SYMBICORT) 160-4.5 MCG/ACT inhaler; Inhale 2 puffs into the lungs 2 (two) times daily. -     Ambulatory referral to Pulmonology -     Care order/instruction:    The patient is advised to call or return to clinic if he does not see an improvement in symptoms, or to seek the care of the closest emergency department if he worsens with the above plan.   Deliah BostonMichael Adkins, MHS, PA-C [facility] 04/27/2017 2:42 PM

## 2017-04-27 NOTE — Patient Instructions (Addendum)
COntinue all of your medications except, start taking your symbicort with 4 puffs in the morning and night.  Once you fill your new prescription then go back to the 2 puffs nightly.  Please see the pulmonologist regarding your asthma.  I am referring you and their staff will contact you for an appointment.     IF you received an x-ray today, you will receive an invoice from [facility]. Please contact [facility] at [phone] with questions or concerns regarding your invoice.   IF you received labwork today, you will receive an invoice from [facility]. Please contact [facility] at [phone] with questions or concerns regarding your invoice.   Our billing staff will not be able to assist you with questions regarding bills from these companies.  You will be contacted with the lab results as soon as they are available. The fastest way to get your results is to activate your My Chart account. Instructions are located on the last page of this paperwork. If you have not heard from us regarding the results in 2 weeks, please contact this office.

## 2017-05-19 ENCOUNTER — Ambulatory Visit (INDEPENDENT_AMBULATORY_CARE_PROVIDER_SITE_OTHER)
Admission: RE | Admit: 2017-05-19 | Discharge: 2017-05-19 | Disposition: A | Discharge status: Home / Self Care | Payer: BLUE CROSS/BLUE SHIELD | Source: Ambulatory Visit | Attending: Pulmonary Disease | Admitting: Pulmonary Disease

## 2017-05-19 ENCOUNTER — Ambulatory Visit (INDEPENDENT_AMBULATORY_CARE_PROVIDER_SITE_OTHER): Payer: BLUE CROSS/BLUE SHIELD | Admitting: Pulmonary Disease

## 2017-05-19 ENCOUNTER — Other Ambulatory Visit (INDEPENDENT_AMBULATORY_CARE_PROVIDER_SITE_OTHER): Payer: BLUE CROSS/BLUE SHIELD

## 2017-05-19 ENCOUNTER — Encounter: Payer: Self-pay | Admitting: Pulmonary Disease

## 2017-05-19 VITALS — BP 124/74 | HR 83 | Ht 62.0 in | Wt 142.8 lb

## 2017-05-19 DIAGNOSIS — R0602 Shortness of breath: Secondary | ICD-10-CM

## 2017-05-19 DIAGNOSIS — R05 Cough: Secondary | ICD-10-CM | POA: Diagnosis not present

## 2017-05-19 LAB — CBC WITH DIFFERENTIAL/PLATELET
BASOS ABS: 0.1 10*3/uL (ref 0.0–0.1)
Basophils Relative: 1.2 % (ref 0.0–3.0)
EOS PCT: 16.9 % — AB (ref 0.0–5.0)
Eosinophils Absolute: 1.2 10*3/uL — ABNORMAL HIGH (ref 0.0–0.7)
HCT: 44.1 % (ref 39.0–52.0)
Hemoglobin: 14.2 g/dL (ref 13.0–17.0)
LYMPHS ABS: 2.1 10*3/uL (ref 0.7–4.0)
Lymphocytes Relative: 29.1 % (ref 12.0–46.0)
MCHC: 32.2 g/dL (ref 30.0–36.0)
MCV: 66.6 fl — AB (ref 78.0–100.0)
MONOS PCT: 6.6 % (ref 3.0–12.0)
Monocytes Absolute: 0.5 10*3/uL (ref 0.1–1.0)
NEUTROS ABS: 3.4 10*3/uL (ref 1.4–7.7)
NEUTROS PCT: 46.2 % (ref 43.0–77.0)
PLATELETS: 210 10*3/uL (ref 150.0–400.0)
RBC: 6.62 Mil/uL — ABNORMAL HIGH (ref 4.22–5.81)
RDW: 15.6 % — ABNORMAL HIGH (ref 11.5–15.5)
WBC: 7.3 10*3/uL (ref 4.0–10.5)

## 2017-05-19 LAB — NITRIC OXIDE: NITRIC OXIDE: 25

## 2017-05-19 MED ORDER — ALBUTEROL SULFATE HFA 108 (90 BASE) MCG/ACT IN AERS: 2.0000 | INHALATION_SPRAY | Freq: Four times a day (QID) | RESPIRATORY_TRACT | 2 refills | Status: DC | PRN

## 2017-05-19 NOTE — Patient Instructions (Addendum)
We will check CBC with differential, blood allergy profile We will get x-ray today and schedule for pulmonary function test Continue using the Symbicort. You need to take 2 puffs twice daily Will give you an albuterol rescue inhaler  Return to clinic in 3 months

## 2017-05-19 NOTE — Progress Notes (Signed)
Timothy Adkins    [id number]    [birth year]  Primary Care Physician:Patient, No Pcp Per  Referring Physician: Ofilia Neas, PA-C [address] [place], [place] [address]  Chief complaint:  Consult for evaluation of asthma  HPI: Timothy Adkins is a 31 year old with history of asthma. He was diagnosed in 2016 treated with qvar, advair. He is currently on Symbicort but is taking it as needed. He has an albuterol rescue inhaler on his med list but tells me that he does not have it at home. His chief complaint is really symptoms of dyspnea, cough with no mucus, wheezing. He does not have nighttime awakenings, denies fevers, chills, hemoptysis.  He was born in [place] and has been in [place] for the past 11 years. He travels back to [place] every couple of years. He has occasional seasonal allergies, no acid reflux. No known exposures. 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  Pets: No pets, birds.  Occupation:Works as a Advertising account planner. Exposures:  No known exposures at work. No mold, hot tubs at home Smoking history:1/2 ppd for 10 year. Quit a few months ago  Outpatient Encounter Prescriptions as of 05/19/2017  Medication Sig  . budesonide-formoterol (SYMBICORT) 160-4.5 MCG/ACT inhaler Inhale 2 puffs into the lungs 2 (two) times daily.  Marland Kitchen buPROPion (ZYBAN) 150 MG 12 hr tablet Take one tablet daily in the morning for one week.  Take one morning and night after one week. Continue for three months.  . fluticasone (FLONASE) 50 MCG/ACT nasal spray Place 2 sprays into both nostrils daily.  . montelukast (SINGULAIR) 10 MG tablet Take 1 tablet (10 mg total) by mouth at bedtime.  . VENTOLIN HFA 108 (90 Base) MCG/ACT inhaler INHALE 2 PUFFS INTO THE LUNGS EVERY 4 HOURS AS NEEDED FOR WHEEZING OR SHORTNESS OF BREATH OR COUGH   No facility-administered encounter medications on file as of 05/19/2017.     Allergies as  of 05/19/2017  . (No Known Allergies)    Past Medical History:  Diagnosis Date  . Asthma     History reviewed. No pertinent surgical history.  Family History  Problem Relation Age of Onset  . Family history unknown: Yes    Social History   Social History  . Marital status: Single    Spouse name: N/A  . Number of children: N/A  . Years of education: N/A   Occupational History  . Not on file.   Social History Main Topics  . Smoking status: Former Smoker    Packs/day: 0.25    Years: 10.00    Quit date: 12/02/2014  . Smokeless tobacco: Never Used  . Alcohol use Yes     Comment: occ  . Drug use: No  . Sexual activity: Yes    Birth control/ protection: Condom   Other Topics Concern  . Not on file   Social History Narrative  . No narrative on file    Review of systems: Review of Systems  Constitutional: Negative for fever and chills.  HENT: Negative.   Eyes: Negative for blurred vision.  Respiratory: as per HPI  Cardiovascular: Negative for chest pain and palpitations.  Gastrointestinal: Negative for vomiting, diarrhea, blood per rectum. Genitourinary: Negative for dysuria, urgency, frequency and hematuria.  Musculoskeletal: Negative for myalgias, back pain and joint pain.  Skin: Negative for itching and rash.  Neurological: Negative for dizziness, tremors, focal  weakness, seizures and loss of consciousness.  Endo/Heme/Allergies: Negative for environmental allergies.  Psychiatric/Behavioral: Negative for depression, suicidal ideas and hallucinations.  All other systems reviewed and are negative.  Physical Exam: Blood pressure 124/74, pulse 83, height 5\' 2"  (1.575 m), weight 142 lb 12.8 oz (64.8 kg), SpO2 99 %. Gen:      No acute distress HEENT:  EOMI, sclera anicteric Neck:     No masses; no thyromegaly Lungs:    Clear to auscultation bilaterally; normal respiratory effort CV:         Regular rate and rhythm; no murmurs Abd:      + bowel sounds; soft,  non-tender; no palpable masses, no distension Ext:    No edema; adequate peripheral perfusion Skin:      Warm and dry; no rash Neuro: alert and oriented x 3 Psych: normal mood and affect  Data Reviewed: FENO 05/19/17- 25  A1AT 129, PIMM  CXR 11/11/16- mild hyperinflation, mild interstitial prominence consistent with bronchitis  Assessment:  Assessment for asthma He is not taking Symbicort on a regular basis. I have instructed him to take 2 puffs twice daily for maximum effect We will give him an albuterol inhaler to be used as needed I will assess his asthma with CBC with differential, blood allergy profile and full pulmonary function test. He'll be scheduled for a chest x-ray today  Plan/Recommendations: - Symbicort 2 puffs bid, Albuterol PRN - CXR, PFTs - CBC with diff, blood allergy profile, PFTs  Timothy GreathousePraveen Seaira Byus MD [facility] Pager [phone] 05/19/2017, 1:32 PM  CC: Timothy Neaslark, Timothy L, PA-C

## 2017-05-20 LAB — RESPIRATORY ALLERGY PROFILE REGION II ~~LOC~~
ALLERGEN, CEDAR TREE, T6: 0.13 kU/L — AB
ALLERGEN, OAK, T7: 0.57 kU/L — AB
Allergen, A. alternata, m6: <0.1 kU/L
Allergen, C. Herbarum, M2: 0.11 kU/L — ABNORMAL HIGH
Allergen, Comm Silver Birch, t9: <0.1 kU/L
Allergen, Cottonwood, t14: 0.7 kU/L — ABNORMAL HIGH
Allergen, D pternoyssinus,d7: 0.14 kU/L — ABNORMAL HIGH
Allergen, Mouse Urine Protein, e78: 0.12 kU/L — ABNORMAL HIGH
Allergen, Mulberry, t76: 0.11 kU/L — ABNORMAL HIGH
Allergen, P. notatum, m1: <0.1 kU/L
Aspergillus fumigatus, m3: <0.1 kU/L
BOX ELDER: 0.37 kU/L — AB
Bermuda Grass: 0.21 kU/L — ABNORMAL HIGH
COCKROACH: 0.26 kU/L — AB
Common Ragweed: 0.12 kU/L — ABNORMAL HIGH
D. farinae: 0.21 kU/L — ABNORMAL HIGH
DOG DANDER: 0.12 kU/L — AB
ELM IGE: 0.1 kU/L — AB
IGE (IMMUNOGLOBULIN E), SERUM: 2018 kU/L — AB (ref <115)
Johnson Grass: 0.3 kU/L — ABNORMAL HIGH
Pecan/Hickory Tree IgE: <0.1 kU/L
Rough Pigweed  IgE: 0.29 kU/L — ABNORMAL HIGH
SHEEP SORREL IGE: 0.21 kU/L — AB
Timothy Grass: 0.21 kU/L — ABNORMAL HIGH

## 2017-09-08 ENCOUNTER — Encounter: Payer: Self-pay | Admitting: Pulmonary Disease

## 2017-09-08 ENCOUNTER — Ambulatory Visit (INDEPENDENT_AMBULATORY_CARE_PROVIDER_SITE_OTHER): Payer: BLUE CROSS/BLUE SHIELD | Admitting: Pulmonary Disease

## 2017-09-08 VITALS — BP 120/68 | HR 85 | Ht 62.0 in | Wt 138.0 lb

## 2017-09-08 DIAGNOSIS — R0602 Shortness of breath: Secondary | ICD-10-CM

## 2017-09-08 LAB — PULMONARY FUNCTION TEST
DL/VA % PRED: 149 %
DL/VA: 6.31 ml/min/mmHg/L
DLCO COR: 31.16 ml/min/mmHg
DLCO cor % pred: 132 %
DLCO unc % pred: 133 %
DLCO unc: 31.42 ml/min/mmHg
FEF 25-75 Post: 4.18 L/sec
FEF 25-75 Pre: 3.26 L/sec
FEF2575-%CHANGE-POST: 28 %
FEF2575-%Pred-Post: 113 %
FEF2575-%Pred-Pre: 88 %
FEV1-%Change-Post: 6 %
FEV1-%PRED-PRE: 98 %
FEV1-%Pred-Post: 104 %
FEV1-PRE: 3.31 L
FEV1-Post: 3.54 L
FEV1FVC-%CHANGE-POST: 4 %
FEV1FVC-%PRED-PRE: 97 %
FEV6-%Change-Post: 2 %
FEV6-Post: 4.09 L
FEV6-Pre: 3.98 L
FEV6FVC-%Change-Post: 0 %
FVC-%Change-Post: 2 %
FVC-%PRED-POST: 103 %
FVC-%Pred-Pre: 101 %
FVC-Post: 4.1 L
FVC-Pre: 4 L
POST FEV1/FVC RATIO: 86 %
POST FEV6/FVC RATIO: 100 %
PRE FEV6/FVC RATIO: 100 %
Pre FEV1/FVC ratio: 83 %

## 2017-09-08 NOTE — Progress Notes (Signed)
PFT done today. 

## 2017-09-08 NOTE — Progress Notes (Signed)
Timothy Adkins    [id number]    February 16, 1986  Primary Care Physician:Patient, No Pcp Per  Referring Physician: No referring provider defined for this encounter.  Chief complaint:  Follow-up for asthma  HPI: Timothy Adkins is a 31 year old with history of asthma. He was diagnosed in 2016 treated with qvar, advair. He is currently on Symbicort but is taking it as needed. He has an albuterol rescue inhaler on his med list but tells me that he does not have it at home. His chief complaint is really symptoms of dyspnea, cough with no mucus, wheezing. He does not have nighttime awakenings, denies fevers, chills, hemoptysis.  He was born in [place] and has been in [place] for the past 11 years. He travels back to [place] every couple of years. He has occasional seasonal allergies, no acid reflux. No known exposures. 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  Pets: No pets, birds.  Occupation:Works as a Advertising account planner. Exposures:  No known exposures at work. No mold, hot tubs at home Smoking history:1/2 ppd for 10 year. Quit a few months ago  Outpatient Encounter Prescriptions as of 09/08/2017  Medication Sig  . albuterol (PROVENTIL HFA;VENTOLIN HFA) 108 (90 Base) MCG/ACT inhaler Inhale 2 puffs into the lungs every 6 (six) hours as needed for wheezing or shortness of breath.  . budesonide-formoterol (SYMBICORT) 160-4.5 MCG/ACT inhaler Inhale 2 puffs into the lungs 2 (two) times daily.  Marland Kitchen buPROPion (ZYBAN) 150 MG 12 hr tablet Take one tablet daily in the morning for one week.  Take one morning and night after one week. Continue for three months.  . fluticasone (FLONASE) 50 MCG/ACT nasal spray Place 2 sprays into both nostrils daily.  . montelukast (SINGULAIR) 10 MG tablet Take 1 tablet (10 mg total) by mouth at bedtime.  . VENTOLIN HFA 108 (90 Base) MCG/ACT inhaler INHALE 2 PUFFS INTO THE LUNGS EVERY 4 HOURS AS  NEEDED FOR WHEEZING OR SHORTNESS OF BREATH OR COUGH   No facility-administered encounter medications on file as of 09/08/2017.     Allergies as of 09/08/2017  . (No Known Allergies)    Past Medical History:  Diagnosis Date  . Asthma     No past surgical history on file.  Family History  Problem Relation Age of Onset  . Family history unknown: Yes    Social History   Social History  . Marital status: Single    Spouse name: N/A  . Number of children: N/A  . Years of education: N/A   Occupational History  . Not on file.   Social History Main Topics  . Smoking status: Former Smoker    Packs/day: 0.25    Years: 10.00    Quit date: 12/02/2014  . Smokeless tobacco: Never Used  . Alcohol use Yes     Comment: occ  . Drug use: No  . Sexual activity: Yes    Birth control/ protection: Condom   Other Topics Concern  . Not on file   Social History Narrative  . No narrative on file    Review of systems: Review of Systems  Constitutional: Negative for fever and chills.  HENT: Negative.   Eyes: Negative for blurred vision.  Respiratory: as per HPI  Cardiovascular: Negative for chest pain and palpitations.  Gastrointestinal: Negative for vomiting, diarrhea, blood per rectum. Genitourinary: Negative for dysuria, urgency, frequency and hematuria.  Musculoskeletal: Negative for myalgias, back pain and joint pain.  Skin: Negative for itching and rash.  Neurological: Negative for dizziness, tremors, focal weakness, seizures and loss of consciousness.  Endo/Heme/Allergies: Negative for environmental allergies.  Psychiatric/Behavioral: Negative for depression, suicidal ideas and hallucinations.  All other systems reviewed and are negative.  Physical Exam: Blood pressure 124/74, pulse 83, height  (1.575 m), weight 142 lb 12.8 oz (64.8 kg), SpO2 99 %. Gen:      No acute distress HEENT:  EOMI, sclera anicteric Neck:     No masses; no thyromegaly Lungs:    Clear to  auscultation bilaterally; normal respiratory effort CV:         Regular rate and rhythm; no murmurs Abd:      + bowel sounds; soft, non-tender; no palpable masses, no distension Ext:    No edema; adequate peripheral perfusion Skin:      Warm and dry; no rash Neuro: alert and oriented x 3 Psych: normal mood and affect  Data Reviewed: FENO 05/19/17- 25  A1AT 129, PIMM  CXR 11/11/16- mild hyperinflation, mild interstitial prominence consistent with bronchitis CXR 05/19/17- no active cardiopulmonary disease. Clear lungs.  CBC with differential 6/12/1 8-WBC 7.3, eosinophil 17%, absolute eosinophil count 1200 Blood allergy profile 05/19/17-sensitive to multiple allergens, IgE 2018  PFTs 09/08/17 FVC 4.10 (103%], FEV1 3.54 [104%],F/F 86, DLCO 133% Elevated diffusion capacity, no obstruction. Unable to perform lung volumes.  Assessment:  Assessment for asthma Stable on Symbicort. He reports improvement in dyspnea on the inhaler  His labs show marked elevation in peripheral eosinophils and IgE levels. This could be from atopic allergy but given his frequent travel to [place] he'll need evaluation for parasitic infection. Will get stool for ova parasites, serology for strongyloides, toxocara, trichnella, schistosomia and filaria. Check blood work to evaluate for EGPA including ANA, ANCA, RF,CCP Will also repeat CBC with diff, LFTs, troponin and B12 levels.  If the above workup in unrevealing then he may need an evaluation with hematology or allergy.   Plan/Recommendations: - Continue Symbicort 2 puffs bid, Albuterol PRN - Check ANA, ANCA, RF, CCP, Parasite studies - repeat CBC with diff, blood smear, IgE levels - Check LFTs, troponin, B12 levels  [name] MD [facility] Pager [phone] 09/08/2017, 9:57 AM  CC: No ref. provider found

## 2017-09-08 NOTE — Patient Instructions (Signed)
I'm glad that her breathing is better with Symbicort Continue the same inhaler Your blood work indicated that you may have allergies Will check blood work today to evaluate this further  Return to clinic in 1-2 months.

## 2017-10-27 ENCOUNTER — Ambulatory Visit: Payer: BLUE CROSS/BLUE SHIELD | Admitting: Pulmonary Disease

## 2017-11-23 IMAGING — CR DG CHEST 2V
2 series · 2 of 2 positions shown · non-contrast
Comparison: Prior radiograph from 05/18/2009.

CLINICAL DATA: Initial evaluation for acute chest pain with cough
and congestion. Fever. Smoker.

EXAM:
CHEST  2 VIEW

[chest pa]
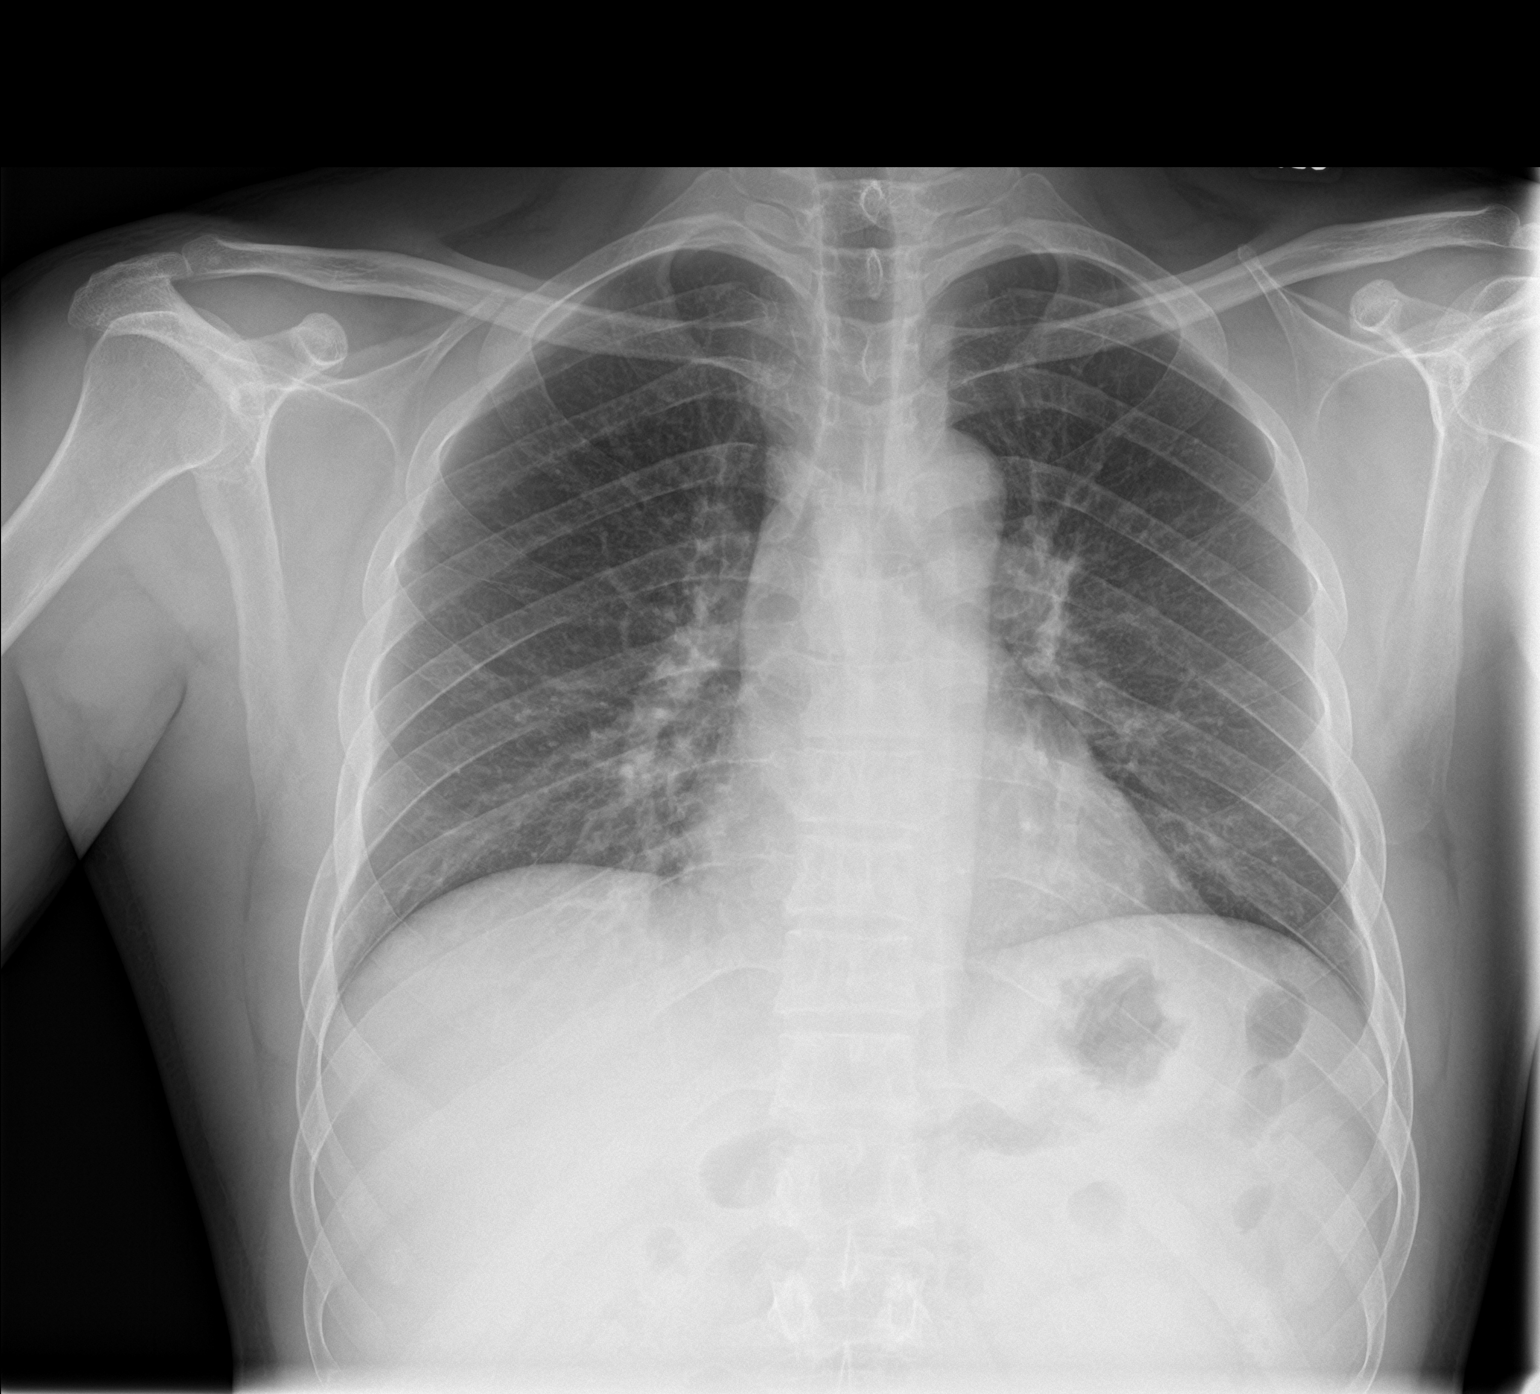

[chest lat]
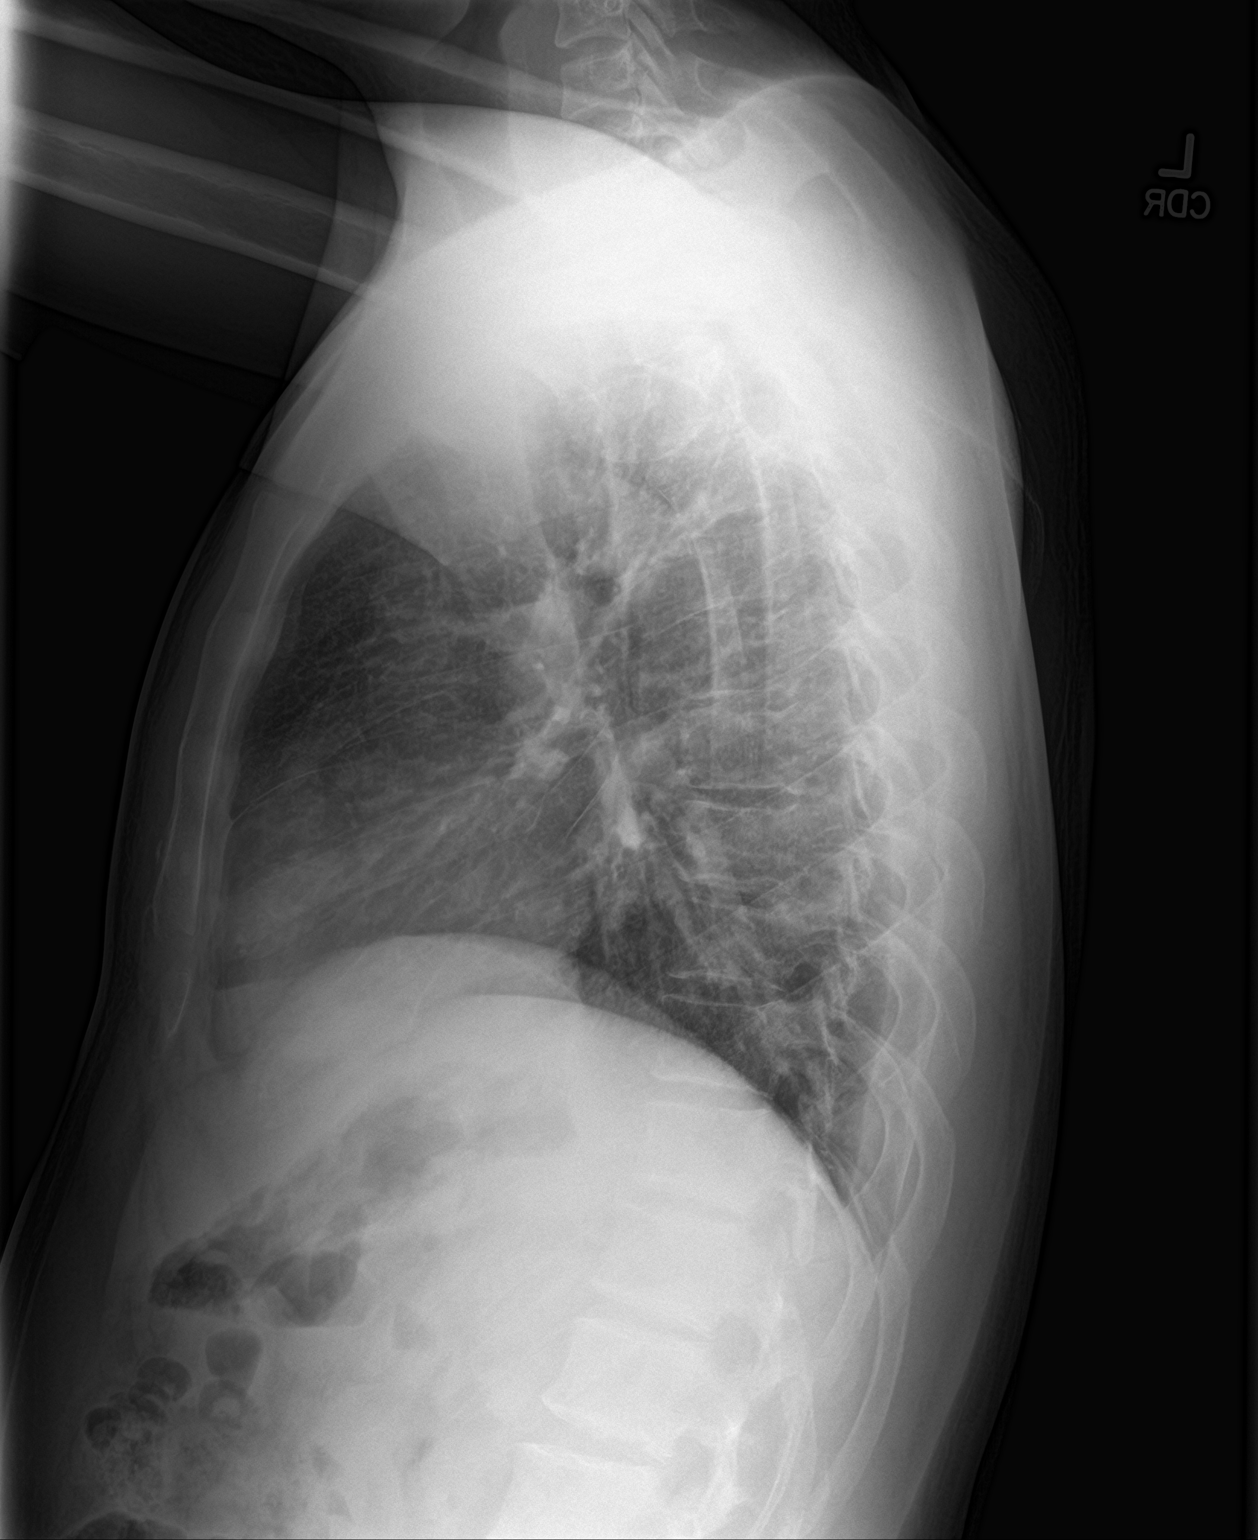

[2 of 2 positions shown; findings below may reference images not displayed]

FINDINGS: Cardiac and mediastinal silhouettes are stable in size and contour,
and remain within normal limits.

Lungs are mildly hypoinflated. No pulmonary edema or pleural
effusion. There is diffuse peribronchial thickening, suggesting
bronchiolitis. Finding may in part be related to history of smoking.
No pneumothorax.

No acute osseus abnormality.
IMPRESSION: Diffuse peribronchial thickening, suggesting acute bronchiolitis
given the history of cough, congestion and fever. No consolidative
opacity to suggest pneumonia.

## 2018-04-27 ENCOUNTER — Other Ambulatory Visit: Payer: Self-pay | Admitting: Physician Assistant

## 2018-04-27 DIAGNOSIS — J4540 Moderate persistent asthma, uncomplicated: Secondary | ICD-10-CM

## 2018-08-19 ENCOUNTER — Telehealth: Payer: Self-pay | Admitting: Pulmonary Disease

## 2018-08-19 NOTE — Telephone Encounter (Signed)
Timothy FlesherWent out to the lobby to speak with pt and explained to him that we could not refill his med due to pt not being seen since 09/08/17 and no showing on 10/27/17.  Pt expressed understanding and stated at the 10/27/17 scheduled visit he ended up going out of the country. I stated to pt if we scheduled an office visit, we could refill his med at that visit.  Pt expressed understanding. Pt scheduled a ROV with Dr. Isaiah SergeMannam 9/16 at 4:15. Stated to pt to keep that visit and then we could refill his symbicort at that visit. Pt expressed understanding. Nothing further needed.

## 2018-08-19 NOTE — Telephone Encounter (Signed)
Patient was last seen by Dr. Isaiah SergeMannam 09/08/17 and was told by Dr. Isaiah SergeMannam to return to the office in 1-2 months from that visit.  Pt no showed for his scheduled visit with Dr. Isaiah SergeMannam 10/27/17.  Due to this, we are unable to refill pt's symbicort prescription or give him a sample without pt scheduling an appointment.  If pt does schedule an appointment and keeps the appointment, we could refill the med at that visit.

## 2018-08-23 ENCOUNTER — Encounter: Payer: Self-pay | Admitting: Pulmonary Disease

## 2018-08-23 ENCOUNTER — Ambulatory Visit: Payer: BLUE CROSS/BLUE SHIELD | Admitting: Pulmonary Disease

## 2018-08-23 ENCOUNTER — Other Ambulatory Visit (INDEPENDENT_AMBULATORY_CARE_PROVIDER_SITE_OTHER): Payer: BLUE CROSS/BLUE SHIELD

## 2018-08-23 VITALS — BP 124/72 | HR 74 | Ht 62.0 in | Wt 140.6 lb

## 2018-08-23 DIAGNOSIS — R0602 Shortness of breath: Secondary | ICD-10-CM

## 2018-08-23 DIAGNOSIS — J4520 Mild intermittent asthma, uncomplicated: Secondary | ICD-10-CM

## 2018-08-23 LAB — CBC WITH DIFFERENTIAL/PLATELET
BASOS ABS: 0.1 10*3/uL (ref 0.0–0.1)
Basophils Relative: 1.4 % (ref 0.0–3.0)
EOS ABS: 1.2 10*3/uL — AB (ref 0.0–0.7)
Eosinophils Relative: 12.4 % — ABNORMAL HIGH (ref 0.0–5.0)
HCT: 43.3 % (ref 39.0–52.0)
Hemoglobin: 13.7 g/dL (ref 13.0–17.0)
LYMPHS ABS: 2.5 10*3/uL (ref 0.7–4.0)
Lymphocytes Relative: 26.5 % (ref 12.0–46.0)
MCHC: 31.6 g/dL (ref 30.0–36.0)
MCV: 68.4 fl — ABNORMAL LOW (ref 78.0–100.0)
Monocytes Absolute: 0.7 10*3/uL (ref 0.1–1.0)
Monocytes Relative: 7.1 % (ref 3.0–12.0)
NEUTROS ABS: 5 10*3/uL (ref 1.4–7.7)
Neutrophils Relative %: 52.6 % (ref 43.0–77.0)
PLATELETS: 190 10*3/uL (ref 150.0–400.0)
RBC: 6.33 Mil/uL — AB (ref 4.22–5.81)
RDW: 15.9 % — ABNORMAL HIGH (ref 11.5–15.5)
WBC: 9.5 10*3/uL (ref 4.0–10.5)

## 2018-08-23 LAB — NITRIC OXIDE: NITRIC OXIDE: 46

## 2018-08-23 LAB — TROPONIN I: TNIDX: 0 ug/L (ref 0.00–0.06)

## 2018-08-23 MED ORDER — ALBUTEROL SULFATE (2.5 MG/3ML) 0.083% IN NEBU: 2.5000 mg | INHALATION_SOLUTION | Freq: Four times a day (QID) | RESPIRATORY_TRACT | 12 refills | Status: AC | PRN

## 2018-08-23 MED ORDER — MONTELUKAST SODIUM 10 MG PO TABS: 10.0000 mg | ORAL_TABLET | Freq: Every day | ORAL | 2 refills | Status: AC

## 2018-08-23 NOTE — Progress Notes (Signed)
Timothy Adkins    [id number]    06/18/1986  Primary Care Physician:Patient, No Pcp Per  Referring Physician: No referring provider defined for this encounter.  Chief complaint:  Follow-up for asthma  HPI: [name] is a 32 year old with history of asthma. He was diagnosed in 2016 treated with qvar, advair. He is currently on Symbicort but is taking it as needed. He has an albuterol rescue inhaler on his med list but tells me that he does not have it at home. His chief complaint is really symptoms of dyspnea, cough with no mucus, wheezing. He does not have nighttime awakenings, denies fevers, chills, hemoptysis.  He was born in [place] and has been in [place] for the past 11 years. He travels back to [place] every couple of years. He has occasional seasonal allergies, no acid reflux. No known exposures. 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  Pets: No pets, birds.  Occupation:Works as a Advertising account planner. Exposures:  No known exposures at work. No mold, hot tubs at home Smoking history:1/2 ppd for 10 year.   Interim history: Patient lost to follow-up for the past year. Complains of worsening dyspnea, daily symptoms of wheezing, nighttime awakening, chest congestion, mucus production  Patient initially said he had been taking his medication but call to to his pharmacy reveals that he has not refilled the Symbicort since March of this year.  Initially denied smoking but his friend tells [place] that he continues to smoke half pack per day.  Physical Exam: Blood pressure 124/74, pulse 83, height 5\' 2"  (1.575 m), weight 142 lb 12.8 oz (64.8 kg), SpO2 99 %. Gen:      No acute distress HEENT:  EOMI, sclera anicteric Neck:     No masses; no thyromegaly Lungs:    Clear to auscultation bilaterally; normal respiratory effort CV:         Regular rate and rhythm; no murmurs Abd:      + bowel sounds; soft,  non-tender; no palpable masses, no distension Ext:    No edema; adequate peripheral perfusion Skin:      Warm and dry; no rash Neuro: alert and oriented x 3 Psych: normal mood and affect  Data Reviewed: FENO 05/19/17- 25 FENO 08/23/18- 46  A1AT 129, PIMM  CXR 11/11/16- mild hyperinflation, mild interstitial prominence consistent with bronchitis CXR 05/19/17- no active cardiopulmonary disease. Clear lungs.  CBC with differential 6/12/1 8-WBC 7.3, eosinophil 17%, absolute eosinophil count 1200 Blood allergy profile 05/19/17-sensitive to multiple allergens, IgE 2018  PFTs 09/08/17 FVC 4.10 (103%], FEV1 3.54 [104%],F/F 86, DLCO 133% Elevated diffusion capacity, no obstruction. Unable to perform lung volumes.  Assessment:  Severe persistent asthma, non compliant Symptoms improved last year on Symbicort, singulair He has not taken this for the past 4 months and reports worsening dyspnea, wheezing, cough   Emphasized to him the need for daily use of inhalers and Singulair to control his breathing Re prescribe Symbicort, Singulair Start Proventil inhaler, nebulizer as rescue medication.  Elevated IgE, Eosinophils His labs show marked elevation in peripheral eosinophils and IgE levels. This could be from atopic allergy but given his frequent travel to [place] he'll need evaluation for parasitic infection. Will get stool for ova parasites, serology for strongyloides, toxocara, trichnella, schistosomia and filaria. Check blood work to evaluate for EGPA including ANA, ANCA, RF,CCP Will also repeat CBC with diff, LFTs,  troponin and B12 levels.  If the above workup in unrevealing then he may need an evaluation with hematology or allergy.   Plan/Recommendations: - Restart Symbicort, albuterol rescue medication - Start Singulair - Check ANA, ANCA, RF, CCP, Parasite studies - Repeat CBC with diff, blood smear, IgE levels - Check LFTs, B12 levels  Chilton GreathousePraveen Jahzir Strohmeier MD [facility] 08/23/2018, 4:44 PM  --------------------------------------------------------------------------------------------------------------  Outpatient Encounter Medications as of 08/23/2018  Medication Sig  . albuterol (PROVENTIL HFA;VENTOLIN HFA) 108 (90 Base) MCG/ACT inhaler Inhale 2 puffs into the lungs every 6 (six) hours as needed for wheezing or shortness of breath.  . budesonide-formoterol (SYMBICORT) 160-4.5 MCG/ACT inhaler Inhale 2 puffs into the lungs 2 (two) times daily.  Marland Kitchen. buPROPion (ZYBAN) 150 MG 12 hr tablet Take one tablet daily in the morning for one week.  Take one morning and night after one week. Continue for three months.  . [DISCONTINUED] fluticasone (FLONASE) 50 MCG/ACT nasal spray Place 2 sprays into both nostrils daily.  . [DISCONTINUED] montelukast (SINGULAIR) 10 MG tablet Take 1 tablet (10 mg total) by mouth at bedtime.  . [DISCONTINUED] VENTOLIN HFA 108 (90 Base) MCG/ACT inhaler INHALE 2 PUFFS INTO THE LUNGS EVERY 4 HOURS AS NEEDED FOR WHEEZING OR SHORTNESS OF BREATH OR COUGH   No facility-administered encounter medications on file as of 08/23/2018.     Allergies as of 08/23/2018  . (No Known Allergies)    Past Medical History:  Diagnosis Date  . Asthma     No past surgical history on file.  Family History  Family history unknown: Yes    Social History   Socioeconomic History  . Marital status: Single    Spouse name: Not on file  . Number of children: Not on file  . Years of education: Not on file  . Highest education level: Not on file  Occupational History  . Not on file  Social Needs  . Financial resource strain: Not on file  . Food insecurity:    Worry: Not on file    Inability: Not on file  . Transportation needs:    Medical: Not on file    Non-medical: Not on file  Tobacco Use  . Smoking status: Current Some Day Smoker    Packs/day: 0.00    Years: 10.00    Pack years: 0.00    Last attempt to quit: 12/02/2014    Years since  quitting: 3.7  . Smokeless tobacco: Never Used  . Tobacco comment: 1 pack of cigarettes will last a week  Substance and Sexual Activity  . Alcohol use: Yes    Comment: occ  . Drug use: No  . Sexual activity: Yes    Birth control/protection: Condom  Lifestyle  . Physical activity:    Days per week: Not on file    Minutes per session: Not on file  . Stress: Not on file  Relationships  . Social connections:    Talks on phone: Not on file    Gets together: Not on file    Attends religious service: Not on file    Active member of club or organization: Not on file    Attends meetings of clubs or organizations: Not on file    Relationship status: Not on file  . Intimate partner violence:    Fear of current or ex partner: Not on file    Emotionally abused: Not on file    Physically abused: Not on file    Forced sexual  activity: Not on file  Other Topics Concern  . Not on file  Social History Narrative  . Not on file    Review of systems: Review of Systems  Constitutional: Negative for fever and chills.  HENT: Negative.   Eyes: Negative for blurred vision.  Respiratory: as per HPI  Cardiovascular: Negative for chest pain and palpitations.  Gastrointestinal: Negative for vomiting, diarrhea, blood per rectum. Genitourinary: Negative for dysuria, urgency, frequency and hematuria.  Musculoskeletal: Negative for myalgias, back pain and joint pain.  Skin: Negative for itching and rash.  Neurological: Negative for dizziness, tremors, focal weakness, seizures and loss of consciousness.  Endo/Heme/Allergies: Negative for environmental allergies.  Psychiatric/Behavioral: Negative for depression, suicidal ideas and hallucinations.  All other systems reviewed and are negative.  CC: No ref. provider found

## 2018-08-23 NOTE — Patient Instructions (Addendum)
You need to use your Symbicort inhaler twice a day every day otherwise your breathing will worsen We will also give you a prescription for Proventil inhaler, nebulizer  We will restart you on Singulair We will recheck some labs today and pulmonary function tests and chest x-ray Follow-up in 3 months.   B?n c?n s? d?ng ?ng ht Symbicort hai l?n m?t ngy m?i ngy n?u khng h?i th? c?a b?n s? t? h?n Chng ti c?ng s? cung c?p cho b?n m?t toa thu?c ht Proventil, my phun s??ng  Chng ti s? kh?i ??ng l?i b?n trn Singulair Chng ti s? ki?m tra l?i m?t s? phng th nghi?m ngy hm nay v ki?m tra ch?c n?ng ph?i v ch?p x quang ph?i Theo di trong 3 thng.

## 2018-08-24 ENCOUNTER — Telehealth: Payer: Self-pay | Admitting: Pulmonary Disease

## 2018-08-24 LAB — ALLERGY PANEL 11, MOLD GROUP
ALLERGEN, MUCOR RACEMOSUS, M4: 0.24 kU/L — AB
Aspergillus fumigatus, m3: <0.1 kU/L
CANDIDA ALBICANS: 0.14 kU/L — AB
CLADOSPORIUM HERBARUM (M2) IGE: <0.1 kU/L
CLASS: 0
CLASS: 0
CLASS: 0
Class: 0
Class: 0

## 2018-08-24 LAB — VITAMIN B12: Vitamin B-12: 1180 pg/mL — ABNORMAL HIGH (ref 211–911)

## 2018-08-24 LAB — COMPREHENSIVE METABOLIC PANEL
ALBUMIN: 4.9 g/dL (ref 3.5–5.2)
ALK PHOS: 56 U/L (ref 39–117)
ALT: 22 U/L (ref 0–53)
AST: 20 U/L (ref 0–37)
BILIRUBIN TOTAL: 0.3 mg/dL (ref 0.2–1.2)
BUN: 21 mg/dL (ref 6–23)
CO2: 20 mEq/L (ref 19–32)
Calcium: 9.6 mg/dL (ref 8.4–10.5)
Chloride: 105 mEq/L (ref 96–112)
Creatinine, Ser: 1.1 mg/dL (ref 0.40–1.50)
GFR: 82.92 mL/min (ref >60.00)
GLUCOSE: 87 mg/dL (ref 70–99)
POTASSIUM: 4.3 meq/L (ref 3.5–5.1)
SODIUM: 139 meq/L (ref 135–145)
TOTAL PROTEIN: 7.2 g/dL (ref 6.0–8.3)

## 2018-08-24 LAB — INTERPRETATION:

## 2018-08-24 NOTE — Telephone Encounter (Signed)
ATC the patient no answer. The order for a neb has been placed already it does take a couple of days to process there the DME company.

## 2018-08-25 ENCOUNTER — Telehealth: Payer: Self-pay | Admitting: Pulmonary Disease

## 2018-08-25 DIAGNOSIS — J4540 Moderate persistent asthma, uncomplicated: Secondary | ICD-10-CM

## 2018-08-25 MED ORDER — ALBUTEROL SULFATE HFA 108 (90 BASE) MCG/ACT IN AERS: 2.0000 | INHALATION_SPRAY | Freq: Four times a day (QID) | RESPIRATORY_TRACT | 2 refills | Status: DC | PRN

## 2018-08-25 MED ORDER — BUDESONIDE-FORMOTEROL FUMARATE 160-4.5 MCG/ACT IN AERO: 2.0000 | INHALATION_SPRAY | Freq: Two times a day (BID) | RESPIRATORY_TRACT | 5 refills | Status: DC

## 2018-08-25 NOTE — Telephone Encounter (Signed)
Spoke with the Clydie BraunKaren with [facility] She states that someone had called late yesterday asking if they testing for leprosy  [facility]  I checked with Dr. Isaiah SergeMannam and he states that this lab is not needed  Nothing further needed

## 2018-08-25 NOTE — Telephone Encounter (Signed)
Spoke with patient, patient wanted to know if meds had been called in. Also requesting that we send order for nebulizer machine. Order for nebulizer machine has been placed and meds have been sent to pharmacy. Voiced understanding. Nothing further needed at this time.

## 2018-08-25 NOTE — Telephone Encounter (Signed)
I have sent refills for the albuterol inhaler and symbicort  Does he need neb sol also? ATC, NA and VM not yet set up[facility]

## 2018-08-25 NOTE — Telephone Encounter (Signed)
Called patient unable to reach unable to leave voicemail.  

## 2018-08-26 NOTE — Telephone Encounter (Signed)
Called spoke with patient Advised patient that order for neb machine was indeed sent on 9.16.19 when he was seen in the office Advised patient that these orders can sometimes take about 1 week for the DME company to file insurance, call the patient, etc.   Advised pt that if he does not hear anything by this coming Monday to call [employer] or this office. Patient voiced his understanding and denied any further questions/concerns  Nothing further needed; will sign off

## 2018-09-01 LAB — CYCLIC CITRUL PEPTIDE ANTIBODY, IGG

## 2018-09-01 LAB — TOXOCARA ANTIBODIES, BLD: Toxocara Antibody, ELISA (Serum): POSITIVE — AB

## 2018-09-01 LAB — ANCA SCREEN W REFLEX TITER: ANCA SCREEN: NEGATIVE

## 2018-09-01 LAB — SCHISTOSOMA IGG, AB, FMI: Schistosoma IgG AB, FMI (Serum): <1

## 2018-09-01 LAB — TRICHINELLA IGG ANTIBODY: Trichinella IgG Antibody, ELISA: NEGATIVE

## 2018-09-01 LAB — RHEUMATOID FACTOR: Rheumatoid fact SerPl-aCnc: <14 IU/mL (ref <14)

## 2018-09-01 LAB — ANA: Anti Nuclear Antibody(ANA): NEGATIVE

## 2018-09-01 LAB — STRONGYLOIDES ANTIBODY: Strongyloides IgG Antibody, ELISA: NEGATIVE

## 2018-09-01 LAB — FILARIA ANTIBODY (IGG4): Filaria Antibody (IgG4): 0.48

## 2018-09-01 LAB — IGE: IgE (Immunoglobulin E), Serum: 1826 kU/L — ABNORMAL HIGH (ref <=114)

## 2018-09-06 ENCOUNTER — Ambulatory Visit (INDEPENDENT_AMBULATORY_CARE_PROVIDER_SITE_OTHER)
Admission: RE | Admit: 2018-09-06 | Discharge: 2018-09-06 | Disposition: A | Discharge status: Home / Self Care | Payer: BLUE CROSS/BLUE SHIELD | Source: Ambulatory Visit | Attending: Pulmonary Disease | Admitting: Pulmonary Disease

## 2018-09-06 DIAGNOSIS — R0602 Shortness of breath: Secondary | ICD-10-CM

## 2018-09-06 DIAGNOSIS — J45909 Unspecified asthma, uncomplicated: Secondary | ICD-10-CM | POA: Diagnosis not present

## 2018-09-07 ENCOUNTER — Telehealth: Payer: Self-pay | Admitting: Pulmonary Disease

## 2018-09-07 NOTE — Telephone Encounter (Signed)
Called and spoke with the patient, he stated that he does not want to do the machine at this time and if he decides to he will order it from a company online. Will route to Dr. Isaiah Serge as an Lorain Childes.

## 2018-09-08 ENCOUNTER — Other Ambulatory Visit: Payer: Self-pay

## 2018-09-08 MED ORDER — ALBENDAZOLE 200 MG PO TABS: 400.0000 mg | ORAL_TABLET | Freq: Two times a day (BID) | ORAL | 0 refills | Status: AC

## 2018-10-12 DIAGNOSIS — H04123 Dry eye syndrome of bilateral lacrimal glands: Secondary | ICD-10-CM | POA: Diagnosis not present

## 2018-10-12 DIAGNOSIS — H40033 Anatomical narrow angle, bilateral: Secondary | ICD-10-CM | POA: Diagnosis not present

## 2018-11-22 ENCOUNTER — Encounter: Payer: Self-pay | Admitting: Pulmonary Disease

## 2018-11-22 ENCOUNTER — Ambulatory Visit: Payer: BLUE CROSS/BLUE SHIELD | Admitting: Pulmonary Disease

## 2018-11-22 ENCOUNTER — Ambulatory Visit (INDEPENDENT_AMBULATORY_CARE_PROVIDER_SITE_OTHER): Payer: BLUE CROSS/BLUE SHIELD | Admitting: Pulmonary Disease

## 2018-11-22 VITALS — BP 122/72 | HR 93 | Ht 62.0 in | Wt 147.8 lb

## 2018-11-22 DIAGNOSIS — J4550 Severe persistent asthma, uncomplicated: Secondary | ICD-10-CM | POA: Diagnosis not present

## 2018-11-22 DIAGNOSIS — J4540 Moderate persistent asthma, uncomplicated: Secondary | ICD-10-CM

## 2018-11-22 DIAGNOSIS — R0602 Shortness of breath: Secondary | ICD-10-CM

## 2018-11-22 LAB — PULMONARY FUNCTION TEST
DL/VA % PRED: 157 %
DL/VA: 6.66 ml/min/mmHg/L
DLCO UNC % PRED: 135 %
DLCO unc: 32.03 ml/min/mmHg
FEF 25-75 POST: 5.27 L/s
FEF 25-75 PRE: 1.51 L/s
FEF2575-%Change-Post: 248 %
FEF2575-%PRED-POST: 144 %
FEF2575-%Pred-Pre: 41 %
FEV1-%CHANGE-POST: 103 %
FEV1-%Pred-Post: 97 %
FEV1-%Pred-Pre: 47 %
FEV1-POST: 3.27 L
FEV1-Pre: 1.6 L
FEV1FVC-%Change-Post: 7 %
FEV1FVC-%Pred-Pre: 95 %
FEV6-%CHANGE-POST: 89 %
FEV6-POST: 3.77 L
FEV6-Pre: 1.98 L
FVC-%Change-Post: 89 %
FVC-%PRED-POST: 95 %
FVC-%PRED-PRE: 50 %
FVC-POST: 3.77 L
FVC-PRE: 1.98 L
PRE FEV1/FVC RATIO: 81 %
Post FEV1/FVC ratio: 87 %
Post FEV6/FVC ratio: 100 %
Pre FEV6/FVC Ratio: 100 %
RV % pred: 96 %
RV: 1.24 L
TLC % pred: 90 %
TLC: 5.04 L

## 2018-11-22 NOTE — Progress Notes (Signed)
PFT done today. 

## 2018-11-22 NOTE — Progress Notes (Signed)
Timothy Adkins    [id number]    06-13-1986  Primary Care Physician:Patient, No Pcp Per  Referring Physician: [name], MD [address] [place], [place] [address]  Chief complaint:  Follow-up for asthma  HPI: Timothy Adkins is a 32 year old with history of asthma. He was diagnosed in 2016 treated with qvar, advair. He is currently on Symbicort but is taking it as needed. He has an albuterol rescue inhaler on his med list but tells me that he does not have it at home. His chief complaint is really symptoms of dyspnea, cough with no mucus, wheezing. He does not have nighttime awakenings, denies fevers, chills, hemoptysis.  He was born in [place] and has been in [place] for the past 11 years. He travels back to [place] every couple of years. He has occasional seasonal allergies, no acid reflux. No known exposures. 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  Pets: No pets, birds.  Occupation:Works as a Advertising account planner. Exposures:  No known exposures at work. No mold, hot tubs at home Smoking history:1/2 ppd for 10 year.   Interim history: Seen after being lost to follow-up for over a year Restarted on Symbicort Work-up for parasite infection in setting of high IgE, eosinophil showed positive antibodies to electrocardiogram He finished a course of albendazole.  He has been using his Symbicort every day.  States that breathing is improved.  He hardly needs to use his rescue inhaler.  Outpatient Encounter Medications as of 11/22/2018  Medication Sig  . albendazole (ALBENZA) 200 MG tablet Take 2 tablets (400 mg total) by mouth 2 (two) times daily.  Marland Kitchen albuterol (PROVENTIL HFA;VENTOLIN HFA) 108 (90 Base) MCG/ACT inhaler Inhale 2 puffs into the lungs every 6 (six) hours as needed for wheezing or shortness of breath.  Marland Kitchen albuterol (PROVENTIL) (2.5 MG/3ML) 0.083% nebulizer solution Take 3 mLs (2.5  mg total) by nebulization every 6 (six) hours as needed for wheezing or shortness of breath. J75.909  . budesonide-formoterol (SYMBICORT) 160-4.5 MCG/ACT inhaler Inhale 2 puffs into the lungs 2 (two) times daily.  Marland Kitchen buPROPion (ZYBAN) 150 MG 12 hr tablet Take one tablet daily in the morning for one week.  Take one morning and night after one week. Continue for three months.  . montelukast (SINGULAIR) 10 MG tablet Take 1 tablet (10 mg total) by mouth at bedtime.   No facility-administered encounter medications on file as of 11/22/2018.    Physical Exam: Blood pressure 122/72, pulse 93, height 5\' 2"  (1.575 m), weight 147 lb 12.8 oz (67 kg), SpO2 96 %. Gen:      No acute distress HEENT:  EOMI, sclera anicteric Neck:     No masses; no thyromegaly Lungs:    Clear to auscultation bilaterally; normal respiratory effort CV:         Regular rate and rhythm; no murmurs Abd:      + bowel sounds; soft, non-tender; no palpable masses, no distension Ext:    No edema; adequate peripheral perfusion Skin:      Warm and dry; no rash Neuro: alert and oriented x 3 Psych: normal mood and affect  Data Reviewed: Imaging CXR 11/11/16- mild hyperinflation, mild interstitial prominence consistent with bronchitis CXR 05/19/17- no active cardiopulmonary disease. Clear lungs.  PFTs 09/08/17 FVC 4.10 (103%], FEV1 3.54 [104%],F/F 86, DLCO 133% Elevated diffusion capacity, no obstruction. Unable to  perform lung volumes.  FENO 05/19/17- 25 FENO 08/23/18- 46  Labs CBC with differential 6/12/1 8-WBC 7.3, eosinophil 17%, absolute eosinophil count 1200 Blood allergy profile 05/19/17-sensitive to multiple allergens, IgE 2018 ANA, CCP, rheumatoid factor, ANCA 08/23/18-negative Parasite serologies 08/23/18-positive for toxocara.  Negative for schistosoma, strongyloides, trichnella Mold antibody panel 08/23/18-negative B12 08/23/18-1180  Assessment:  Severe persistent asthma,  Symptoms improved last year on Symbicort,  singulair  Continue current management Consider biologics but I would like to ensure that he has been compliant with his inhalers before initiating therapy  Elevated IgE, Eosinophils His labs show marked elevation in peripheral eosinophils and IgE levels. This could be from atopic allergy but given his frequent travel to [place] he was evalauted for parasitic infection.  He has been treated for possible parasitic infection given positive antibodies for Toxocara  Repeat IgE and CBC  Plan/Recommendations: - Continue Symbicort, albuterol rescue medication - Singulair - Repeat CBC with diff, IgE levels  Chilton GreathousePraveen Eloyse Causey MD [facility] 08/23/2018, 4:44 PM

## 2018-11-22 NOTE — Patient Instructions (Signed)
Continue your inhalers.  Make sure you use them every day We will recheck some labs today including CBC differential, IgE Work on smoking cessation. Follow-up in 3 months.

## 2018-11-23 LAB — CBC WITH DIFFERENTIAL/PLATELET
Absolute Monocytes: 697 cells/uL (ref 200–950)
Basophils Absolute: 77 cells/uL (ref 0–200)
Basophils Relative: 0.9 %
EOS ABS: 662 {cells}/uL — AB (ref 15–500)
Eosinophils Relative: 7.7 %
HEMATOCRIT: 40.7 % (ref 38.5–50.0)
Hemoglobin: 12.8 g/dL — ABNORMAL LOW (ref 13.2–17.1)
Lymphs Abs: 2890 cells/uL (ref 850–3900)
MCH: 22.3 pg — ABNORMAL LOW (ref 27.0–33.0)
MCHC: 31.4 g/dL — ABNORMAL LOW (ref 32.0–36.0)
MCV: 70.8 fL — ABNORMAL LOW (ref 80.0–100.0)
MPV: 11.3 fL (ref 7.5–12.5)
Monocytes Relative: 8.1 %
Neutro Abs: 4274 cells/uL (ref 1500–7800)
Neutrophils Relative %: 49.7 %
Platelets: 206 10*3/uL (ref 140–400)
RBC: 5.75 10*6/uL (ref 4.20–5.80)
RDW: 15.3 % — ABNORMAL HIGH (ref 11.0–15.0)
Total Lymphocyte: 33.6 %
WBC: 8.6 10*3/uL (ref 3.8–10.8)

## 2018-11-23 LAB — IGE: IgE (Immunoglobulin E), Serum: 1560 kU/L — ABNORMAL HIGH (ref <=114)

## 2019-03-01 ENCOUNTER — Ambulatory Visit: Payer: BLUE CROSS/BLUE SHIELD | Admitting: Pulmonary Disease

## 2020-03-07 ENCOUNTER — Telehealth: Payer: Self-pay | Admitting: Pulmonary Disease

## 2020-03-07 DIAGNOSIS — J4540 Moderate persistent asthma, uncomplicated: Secondary | ICD-10-CM

## 2020-03-07 MED ORDER — BUDESONIDE-FORMOTEROL FUMARATE 160-4.5 MCG/ACT IN AERO: 2.0000 | INHALATION_SPRAY | Freq: Two times a day (BID) | RESPIRATORY_TRACT | 1 refills | Status: DC

## 2020-03-07 MED ORDER — ALBUTEROL SULFATE HFA 108 (90 BASE) MCG/ACT IN AERS: 2.0000 | INHALATION_SPRAY | Freq: Four times a day (QID) | RESPIRATORY_TRACT | 1 refills | Status: AC | PRN

## 2020-03-07 NOTE — Telephone Encounter (Signed)
ATC Patient x's 2.  No answer, no VM.  After several rings, busy signal.  CXR order placed. Will try again later today.

## 2020-03-07 NOTE — Addendum Note (Signed)
Addended by: Jacquiline Doe on: 03/07/2020 03:49 PM   Modules accepted: Orders

## 2020-03-07 NOTE — Telephone Encounter (Signed)
Pt in lobby.Caren Griffins

## 2020-03-07 NOTE — Telephone Encounter (Signed)
He needs to use his Symbicort 2 puffs twice daily. Rinse mouth after use  He needs to use albuterol rescue 2 puffs  up to 4 times daily as needed for breakthrough shortness of breath. Send rescue inhaler in if he does not have one He needs to be seen at first available appointment Seek emergency care if he gets worse not better. He needs a CXR, please  place order for  tomorrow  APP of the day can call him with results. Thanks

## 2020-03-07 NOTE — Telephone Encounter (Signed)
ATC patient.  No answer, no VM, and rings busy after several rings.

## 2020-03-07 NOTE — Telephone Encounter (Signed)
Spoke with pt. Pt states he has had increase in SOB, prod cough with white/yellow mucus, wheezing, chest tightness fatigue x 3 days. Pt states he hasnt used his symbicort in year, new rx sent to preferred pharmacy. Pt denies swelling, f/c/s. Pt requesting recs. Soonest availability is the week of 4/5. Pt last seen in 11/2018 by Dr. Kathee Delton, please advise. Thanks.

## 2020-03-08 NOTE — Telephone Encounter (Signed)
Attempted to call pt but line rang and rang and then went to a busy tone. Unable to leave VM. Will try to call back later.

## 2020-03-08 NOTE — Telephone Encounter (Signed)
ATC Patient, line rings, then goes to busy signal. Unable to leave message.

## 2020-03-14 NOTE — Progress Notes (Deleted)
@Patient  ID: Timothy Adkins, male    DOB: 1986/08/18, 34 y.o.   MRN: [id number]  No chief complaint on file.   Referring provider: No ref. provider found  HPI:  34 year old male never smoker followed in our office for asthma  PMH: History of parasitic infection Smoker/ Smoking History:  Maintenance: Symbicort 160 Pt of: Dr. Vaughan Browner  03/14/2020  - Visit   34 year old male never smoker followed in our office for asthma. Patient last seen in our office in December/2019. Patient presented to our office as a walk-in patient on 03/07/2020 reporting acute worsening symptoms. Patient is scheduled for next available office visit which is today.  Questionaires / Pulmonary Flowsheets:   ACT:  No flowsheet data found.  MMRC: No flowsheet data found.  Epworth:  No flowsheet data found.  Tests:   11/22/2018-IgE-1560 11/22/2018-CBC with differential-eosinophils relative 7.7, eosinophils absolute 662   Imaging CXR 11/11/16- mild hyperinflation, mild interstitial prominence consistent with bronchitis CXR 05/19/17- no active cardiopulmonary disease. Clear lungs.  PFTs 09/08/17 FVC 4.10 (103%], FEV1 3.54 [104%],F/F 86, DLCO 133% Elevated diffusion capacity, no obstruction. Unable to perform lung volumes.  FENO 05/19/17- 25 FENO 08/23/18- 46  Labs CBC with differential 6/12/1 8-WBC 7.3, eosinophil 17%, absolute eosinophil count 1200 Blood allergy profile 05/19/17-sensitive to multiple allergens, IgE 2018 ANA, CCP, rheumatoid factor, ANCA 08/23/18-negative Parasite serologies 08/23/18-positive for toxocara.  Negative for schistosoma, strongyloides, trichnella Mold antibody panel 08/23/18-negative B12 08/23/18-1180  FENO:  Lab Results  Component Value Date   NITRICOXIDE 46 08/23/2018    PFT: PFT Results Latest Ref Rng & Units 11/22/2018 09/08/2017  FVC-Pre L 1.98 4.00  FVC-Predicted Pre % 50 101  FVC-Post L 3.77 4.10  FVC-Predicted Post % 95 103  Pre FEV1/FVC % % 81 83  Post FEV1/FCV %  % 87 86  FEV1-Pre L 1.60 3.31  FEV1-Predicted Pre % 47 98  FEV1-Post L 3.27 3.54  DLCO UNC% % 135 133  DLCO COR %Predicted % 157 149  TLC L 5.04 -  TLC % Predicted % 90 -  RV % Predicted % 96 -    WALK:  No flowsheet data found.  Imaging: No results found.  Lab Results:  CBC    Component Value Date/Time   WBC 8.6 11/22/2018 1442   RBC 5.75 11/22/2018 1442   HGB 12.8 (L) 11/22/2018 1442   HCT 40.7 11/22/2018 1442   PLT 206 11/22/2018 1442   MCV 70.8 (L) 11/22/2018 1442   MCV 66.9 (A) 11/11/2016 1732   MCH 22.3 (L) 11/22/2018 1442   MCHC 31.4 (L) 11/22/2018 1442   RDW 15.3 (H) 11/22/2018 1442   LYMPHSABS 2,890 11/22/2018 1442   MONOABS 0.7 08/23/2018 1726   EOSABS 662 (H) 11/22/2018 1442   BASOSABS 77 11/22/2018 1442    BMET    Component Value Date/Time   NA 139 08/23/2018 1726   K 4.3 08/23/2018 1726   CL 105 08/23/2018 1726   CO2 20 08/23/2018 1726   GLUCOSE 87 08/23/2018 1726   BUN 21 08/23/2018 1726   CREATININE 1.10 08/23/2018 1726   CALCIUM 9.6 08/23/2018 1726    BNP No results found for: BNP  ProBNP No results found for: PROBNP  Specialty Problems      Pulmonary Problems   Asthmatic bronchitis      No Known Allergies   There is no immunization history on file for this patient.  Past Medical History:  Diagnosis Date  . Asthma     Tobacco History:  Social History   Tobacco Use  Smoking Status Current Every Day Smoker  . Packs/day: 0.00  . Years: 10.00  . Pack years: 0.00  . Last attempt to quit: 12/02/2014  . Years since quitting: 5.2  Smokeless Tobacco Never Used  Tobacco Comment   1 pack of cigarettes will last a week   Ready to quit: Not Answered Counseling given: Not Answered Comment: 1 pack of cigarettes will last a week   Continue to not smoke  Outpatient Encounter Medications as of 03/15/2020  Medication Sig  . albendazole (ALBENZA) 200 MG tablet Take 2 tablets (400 mg total) by mouth 2 (two) times daily.  Marland Kitchen  albuterol (PROVENTIL) (2.5 MG/3ML) 0.083% nebulizer solution Take 3 mLs (2.5 mg total) by nebulization every 6 (six) hours as needed for wheezing or shortness of breath. J75.909  . albuterol (VENTOLIN HFA) 108 (90 Base) MCG/ACT inhaler Inhale 2 puffs into the lungs every 6 (six) hours as needed for wheezing or shortness of breath.  . budesonide-formoterol (SYMBICORT) 160-4.5 MCG/ACT inhaler Inhale 2 puffs into the lungs 2 (two) times daily.  Marland Kitchen buPROPion (ZYBAN) 150 MG 12 hr tablet Take one tablet daily in the morning for one week.  Take one morning and night after one week. Continue for three months.  . montelukast (SINGULAIR) 10 MG tablet Take 1 tablet (10 mg total) by mouth at bedtime.   No facility-administered encounter medications on file as of 03/15/2020.     Review of Systems  Review of Systems   Physical Exam  There were no vitals taken for this visit.  Wt Readings from Last 5 Encounters:  11/22/18 147 lb 12.8 oz (67 kg)  08/23/18 140 lb 9.6 oz (63.8 kg)  09/08/17 138 lb (62.6 kg)  05/19/17 142 lb 12.8 oz (64.8 kg)  04/27/17 143 lb (64.9 kg)    BMI Readings from Last 5 Encounters:  11/22/18 27.03 kg/m  08/23/18 25.72 kg/m  09/08/17 25.24 kg/m  05/19/17 26.12 kg/m  04/27/17 25.74 kg/m     Physical Exam    Assessment & Plan:   No problem-specific Assessment & Plan notes found for this encounter.    No follow-ups on file.   Coral Ceo, NP 03/14/2020   This appointment required *** minutes of patient care (this includes precharting, chart review, review of results, face-to-face care, etc.).

## 2020-03-15 ENCOUNTER — Ambulatory Visit: Payer: BLUE CROSS/BLUE SHIELD | Admitting: Pulmonary Disease

## 2020-03-20 ENCOUNTER — Telehealth: Payer: Self-pay | Admitting: Pulmonary Disease

## 2020-03-20 NOTE — Telephone Encounter (Signed)
ATC Patient.  Unable to leave message, VM full.

## 2020-03-22 NOTE — Telephone Encounter (Signed)
ATC pt, call went straight to VM but his VM is full. Will try back.

## 2020-03-23 NOTE — Telephone Encounter (Signed)
ATC pt, call went straight to VM but his VM is full. We have attempted to contact pt several times with no success or call back from pt. Per triage protocol, message will be closed.

## 2020-03-24 ENCOUNTER — Ambulatory Visit: Payer: BLUE CROSS/BLUE SHIELD | Attending: Internal Medicine

## 2020-03-24 DIAGNOSIS — Z23 Encounter for immunization: Secondary | ICD-10-CM

## 2020-03-24 NOTE — Progress Notes (Signed)
   Covid-19 Vaccination Clinic  Name:  Timothy Adkins    MRN: [id number] DOB: 03/27/1986  03/24/2020  [name] was observed post Covid-19 immunization for 15 minutes without incident. He was provided with Vaccine Information Sheet and instruction to access the V-Safe system.   [name] was instructed to call 911 with any severe reactions post vaccine: Marland Kitchen Difficulty breathing  . Swelling of face and throat  . A fast heartbeat  . A bad rash all over body  . Dizziness and weakness   Immunizations Administered    Name Date Dose VIS Date Route   Pfizer COVID-19 Vaccine 03/24/2020  1:54 PM 0.3 mL 11/18/2019 Intramuscular   Manufacturer: [employer], [employer]   Lot: [id number]   NDC: [address]-2

## 2020-04-16 ENCOUNTER — Ambulatory Visit: Payer: BLUE CROSS/BLUE SHIELD | Attending: Internal Medicine

## 2020-04-16 DIAGNOSIS — Z23 Encounter for immunization: Secondary | ICD-10-CM

## 2020-04-16 NOTE — Progress Notes (Signed)
   Covid-19 Vaccination Clinic  Name:  Efe Fazzino    MRN: [id number] DOB: [birth year]  04/16/2020  Mr. Norville was observed post Covid-19 immunization for 15 minutes without incident. He was provided with Vaccine Information Sheet and instruction to access the V-Safe system.   Mr. Mangano was instructed to call 911 with any severe reactions post vaccine: Marland Kitchen Difficulty breathing  . Swelling of face and throat  . A fast heartbeat  . A bad rash all over body  . Dizziness and weakness   Immunizations Administered    Name Date Dose VIS Date Route   Pfizer COVID-19 Vaccine 04/16/2020 10:16 AM 0.3 mL 02/01/2019 Intramuscular   Manufacturer: [employer], [employer]   Lot: [id number]   NDC: [address]-0

## 2020-07-26 IMAGING — CT CT CHEST HIGH RESOLUTION W/O CM
3 of 5 series · 14 of 36 positions shown, 15 images · non-contrast
Comparison: 05/19/2017 chest radiograph.

CLINICAL DATA: Dyspnea.  Asthma.  Clinical concern for ABPA.

EXAM:
CT CHEST WITHOUT CONTRAST
TECHNIQUE: Multidetector CT imaging of the chest was performed following the
standard protocol without intravenous contrast. High resolution
imaging of the lungs, as well as inspiratory and expiratory imaging,
was performed.

[Series 2: insp 12 images 1.0 b80s · axial · 0.64mm/px · z∈[-448,-178]mm · 3 of 20 slices shown, 4 images]
[im 1/20  mediastinal]
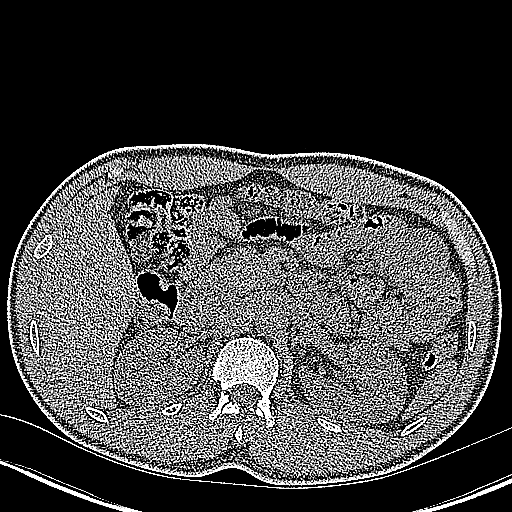
[im 1/20  lung]
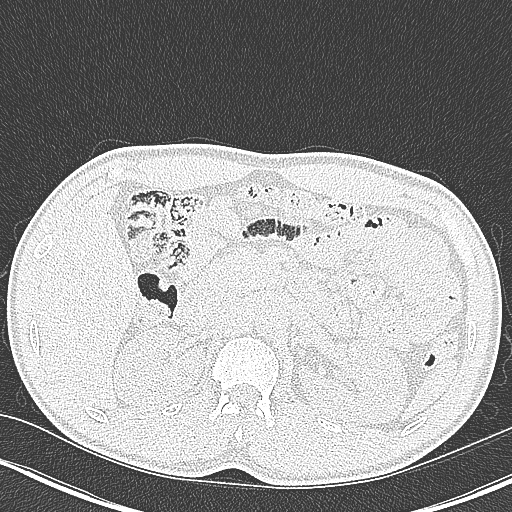
[im 10/20  lung]
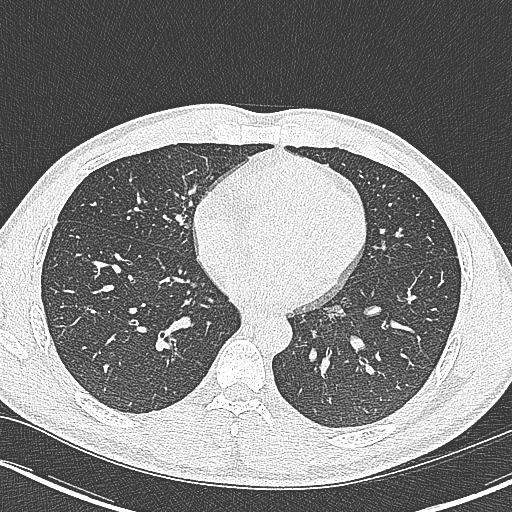
[im 20/20  lung]
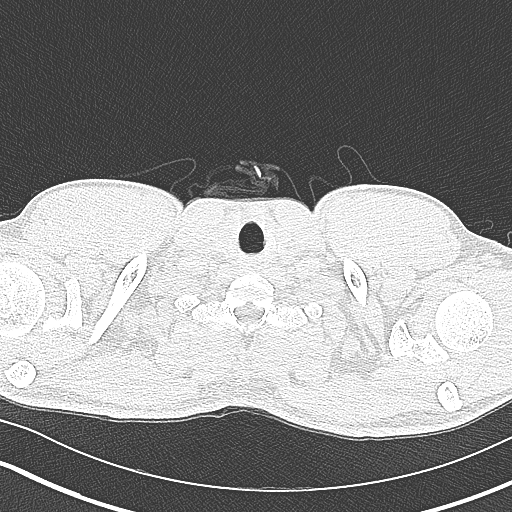

[Series 4: high resolution · axial · 0.66mm/px · z∈[-408,-204]mm · 8 of 128 slices shown]
[im 13/128  lung]
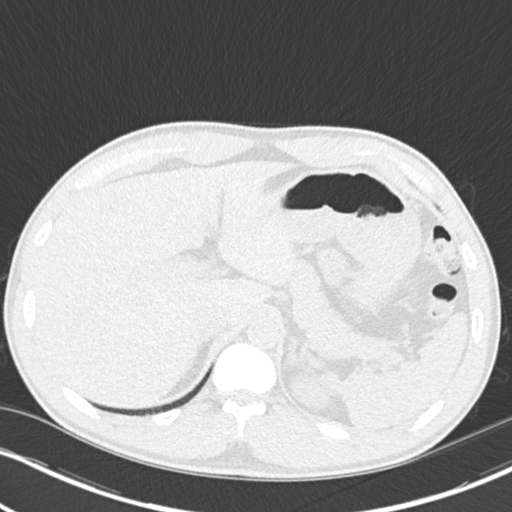
[im 25/128  lung]
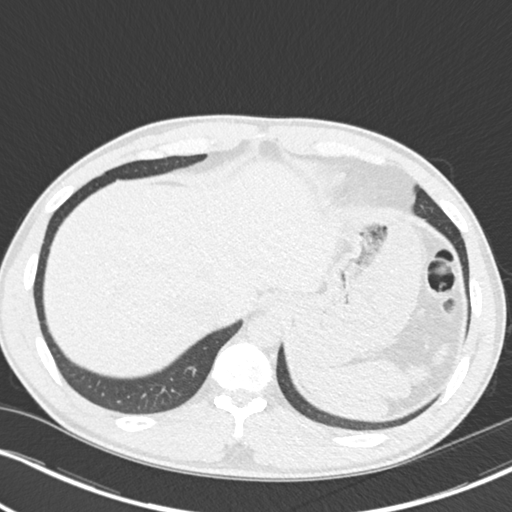
[im 43/128  lung]
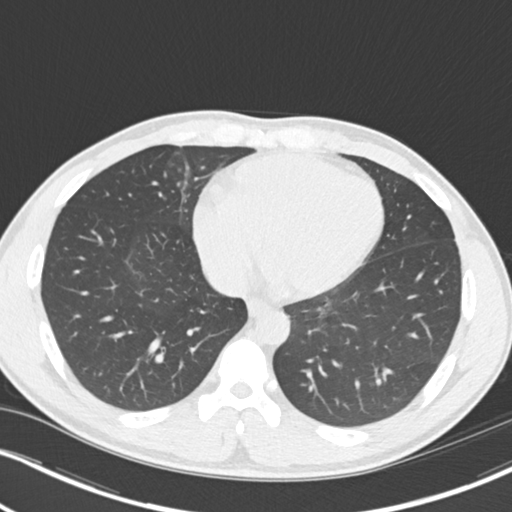
[im 55/128  lung]
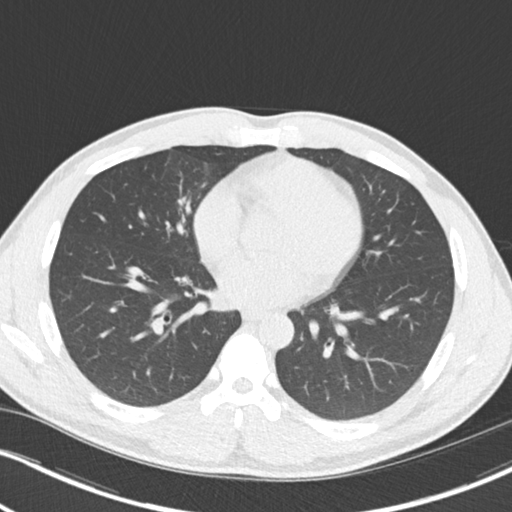
[im 73/128  lung]
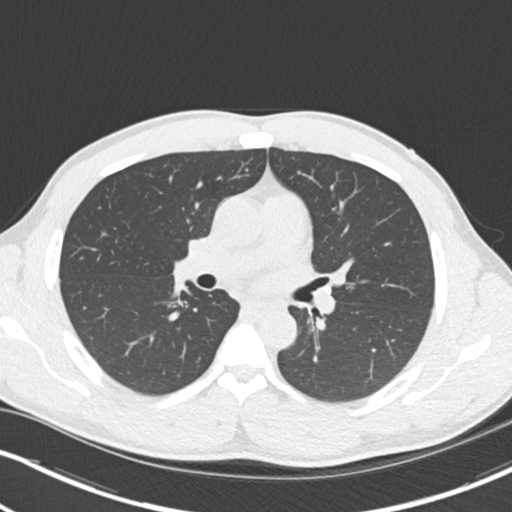
[im 85/128  lung]
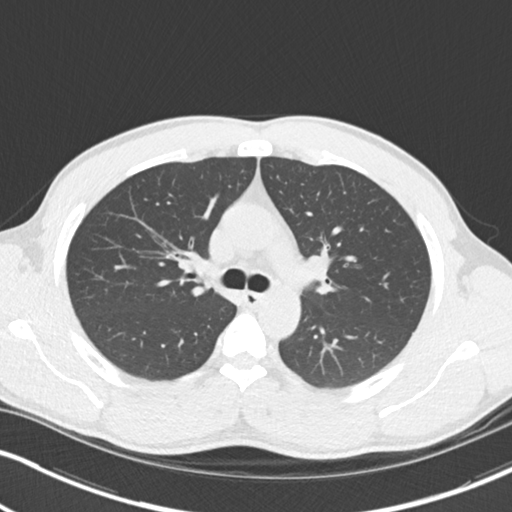
[im 103/128  lung]
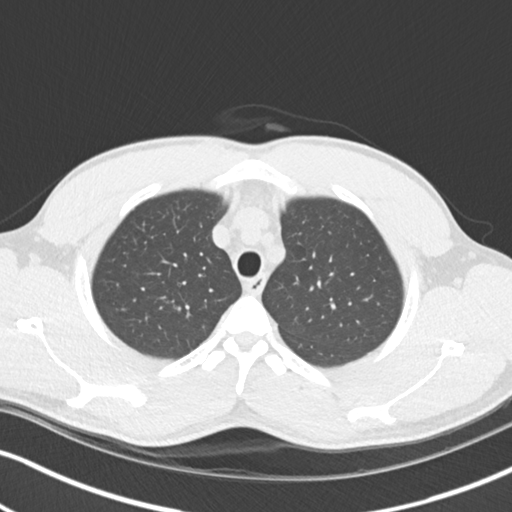
[im 115/128  lung]
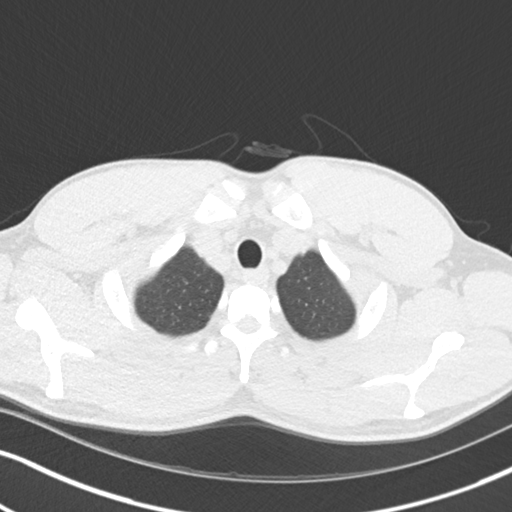

[Series 7: coronal · coronal · 0.51mm/px · 3 of 124 slices shown]
[im 25/124  lung]
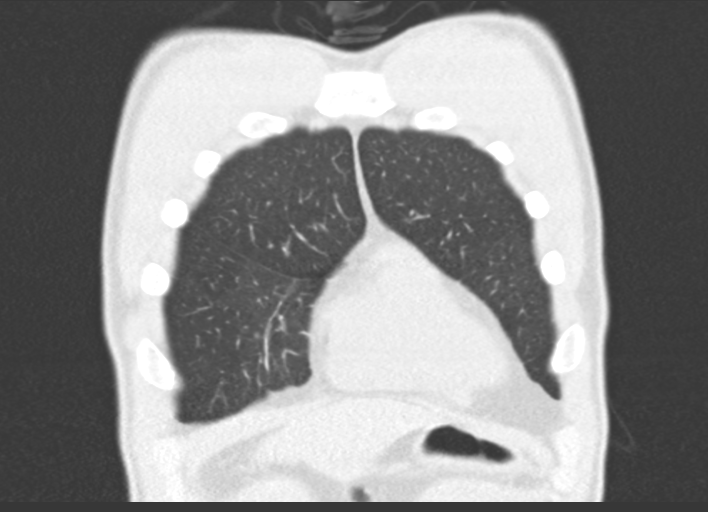
[im 50/124  lung]
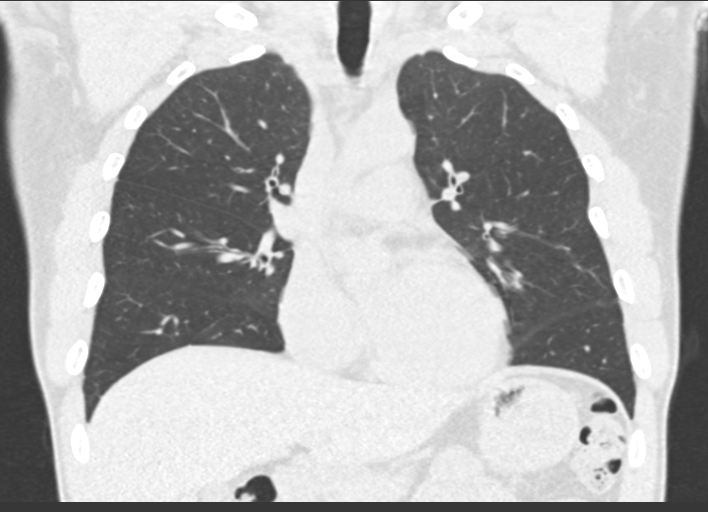
[im 74/124  lung]
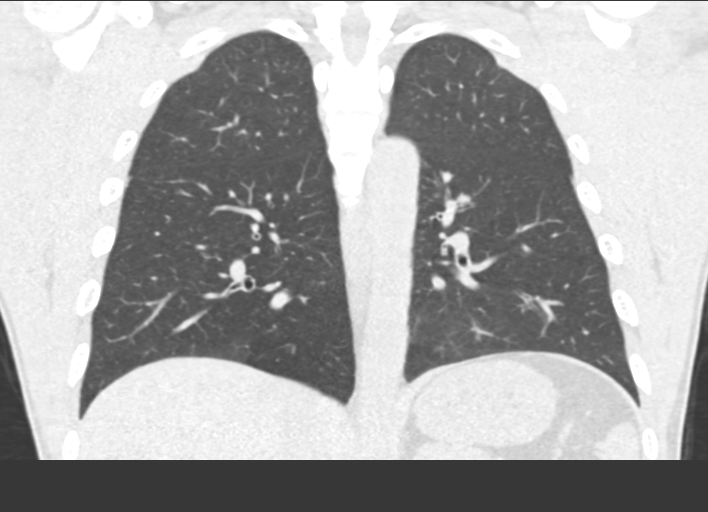

[14 of 36 positions shown; findings below may reference images not displayed]

FINDINGS: Cardiovascular: Normal heart size. No significant pericardial
effusion/thickening. Great vessels are normal in course and caliber.

Mediastinum/Nodes: No discrete thyroid nodules. Unremarkable
esophagus. No pathologically enlarged axillary, mediastinal or hilar
lymph nodes, noting limited sensitivity for the detection of hilar
adenopathy on this noncontrast study. Coarsely calcified nonenlarged
subcarinal and left hilar nodes from prior granulomatous disease.

Lungs/Pleura: No pneumothorax. No pleural effusion. No acute
consolidative airspace disease, lung masses or significant pulmonary
nodules. Moderate diffuse bronchial wall thickening. No significant
regions of subpleural reticulation, ground-glass attenuation,
traction bronchiectasis, architectural distortion or frank
honeycombing. Small parenchymal band in the medial segment right
middle lobe is compatible with nonspecific mild
postinfectious/postinflammatory scarring. No significant lobular air
trapping on the expiration sequence. No high-density material within
the airways.

Upper abdomen: No acute abnormality.

Musculoskeletal:  No aggressive appearing focal osseous lesions.
IMPRESSION: 1. Nonspecific moderate diffuse bronchial wall thickening, as can be
seen with chronic bronchitis and/or reactive airways disease.
2. No evidence of interstitial lung disease. No significant
bronchiectasis.

## 2020-07-31 ENCOUNTER — Other Ambulatory Visit: Payer: Self-pay | Admitting: Pulmonary Disease

## 2020-07-31 DIAGNOSIS — J4540 Moderate persistent asthma, uncomplicated: Secondary | ICD-10-CM

## 2020-10-08 ENCOUNTER — Other Ambulatory Visit: Payer: Self-pay | Admitting: Pulmonary Disease

## 2020-10-08 DIAGNOSIS — J4540 Moderate persistent asthma, uncomplicated: Secondary | ICD-10-CM

## 2020-10-09 ENCOUNTER — Other Ambulatory Visit: Payer: Self-pay | Admitting: Pulmonary Disease

## 2020-10-09 DIAGNOSIS — J4540 Moderate persistent asthma, uncomplicated: Secondary | ICD-10-CM

## 2020-11-07 ENCOUNTER — Telehealth: Payer: Self-pay | Admitting: Pulmonary Disease

## 2020-11-07 ENCOUNTER — Other Ambulatory Visit: Payer: Self-pay | Admitting: Pulmonary Disease

## 2020-11-07 DIAGNOSIS — J4540 Moderate persistent asthma, uncomplicated: Secondary | ICD-10-CM

## 2020-11-08 NOTE — Telephone Encounter (Signed)
ATC patient via [employer]  ID: [id number] of interpreter [phone]  VM box was full interpreter unable to leave message  Patient has follow up with Dr. Isaiah Serge on 11/20/20

## 2020-11-09 ENCOUNTER — Other Ambulatory Visit: Payer: Self-pay | Admitting: Pulmonary Disease

## 2020-11-09 DIAGNOSIS — J4540 Moderate persistent asthma, uncomplicated: Secondary | ICD-10-CM

## 2020-11-09 MED ORDER — BUDESONIDE-FORMOTEROL FUMARATE 160-4.5 MCG/ACT IN AERO: 2.0000 | INHALATION_SPRAY | Freq: Two times a day (BID) | RESPIRATORY_TRACT | 0 refills | Status: DC

## 2020-11-09 NOTE — Telephone Encounter (Signed)
Timothy Adkins stated pt called back himself and can speak english  Called pt back and he did not answer and his VM box is full, [facility]

## 2020-11-09 NOTE — Telephone Encounter (Signed)
[phone] pt calling back

## 2020-11-09 NOTE — Telephone Encounter (Signed)
Patient is returning phone call. Patient phone number is [phone].

## 2020-11-09 NOTE — Telephone Encounter (Signed)
Spoke with pt, needing Symbicort refill.  Pt seeing Dr. Isaiah Serge 12/14.  Sent 1 refill to last until upcoming appt to preferred pharmacy.  Nothing further needed at this time- will close encounter.

## 2020-11-20 ENCOUNTER — Other Ambulatory Visit: Payer: Self-pay

## 2020-11-20 ENCOUNTER — Ambulatory Visit (INDEPENDENT_AMBULATORY_CARE_PROVIDER_SITE_OTHER): Payer: BLUE CROSS/BLUE SHIELD | Admitting: Pulmonary Disease

## 2020-11-20 ENCOUNTER — Encounter: Payer: Self-pay | Admitting: Pulmonary Disease

## 2020-11-20 VITALS — BP 116/68 | HR 80 | Temp 97.2°F | Ht 62.0 in | Wt 146.4 lb

## 2020-11-20 DIAGNOSIS — Z23 Encounter for immunization: Secondary | ICD-10-CM

## 2020-11-20 DIAGNOSIS — J4550 Severe persistent asthma, uncomplicated: Secondary | ICD-10-CM

## 2020-11-20 LAB — CBC WITH DIFFERENTIAL/PLATELET
Basophils Absolute: 0 10*3/uL (ref 0.0–0.1)
Basophils Relative: 0.7 % (ref 0.0–3.0)
Eosinophils Absolute: 0.3 10*3/uL (ref 0.0–0.7)
Eosinophils Relative: 7.2 % — ABNORMAL HIGH (ref 0.0–5.0)
HCT: 44.1 % (ref 39.0–52.0)
Hemoglobin: 13.9 g/dL (ref 13.0–17.0)
Lymphocytes Relative: 49.8 % — ABNORMAL HIGH (ref 12.0–46.0)
Lymphs Abs: 2.3 10*3/uL (ref 0.7–4.0)
MCHC: 31.4 g/dL (ref 30.0–36.0)
MCV: 69 fl — ABNORMAL LOW (ref 78.0–100.0)
Monocytes Absolute: 0.5 10*3/uL (ref 0.1–1.0)
Monocytes Relative: 11.4 % (ref 3.0–12.0)
Neutro Abs: 1.4 10*3/uL (ref 1.4–7.7)
Neutrophils Relative %: 30.9 % — ABNORMAL LOW (ref 43.0–77.0)
Platelets: 149 10*3/uL — ABNORMAL LOW (ref 150.0–400.0)
RBC: 6.4 Mil/uL — ABNORMAL HIGH (ref 4.22–5.81)
RDW: 15.5 % (ref 11.5–15.5)
WBC: 4.6 10*3/uL (ref 4.0–10.5)

## 2020-11-20 NOTE — Progress Notes (Signed)
Timothy Adkins    [id number]    [birth year]  Primary Care Physician:Patient, No Pcp Per  Referring Physician: No referring provider defined for this encounter.  Problem list:  T2 high asthma Severe persistent asthma  HPI: [name] is a 34 year old with history of asthma. He was diagnosed in 2016 treated with qvar, advair. He is currently on Symbicort but is taking it as needed. He has an albuterol rescue inhaler on his med list but tells me that he does not have it at home. His chief complaint is really symptoms of dyspnea, cough with no mucus, wheezing. He does not have nighttime awakenings, denies fevers, chills, hemoptysis.  He was born in [place] and has been in [place] for the past 11 years. He travels back to [place] every couple of years. He has occasional seasonal allergies, no acid reflux. No known exposures. 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  Work-up for parasite infection in setting of high IgE, eosinophil showed positive antibodies to Toxocara He finished a course of albendazole in November 2019  Pets: No pets, birds.  Occupation:Works as a Advertising account planner. Exposures:  No known exposures at work. No mold, hot tubs at home Smoking history:1/2 ppd for 10 year.   Interim history: Continues on Symbicort.  Tells me that he is using it regularly 2 puffs twice daily Cold weather has made his breathing worse.  He now has daily nighttime symptoms of dyspnea, nighttime awakening.  Outpatient Encounter Medications as of 11/22/2018  Medication Sig  . albendazole (ALBENZA) 200 MG tablet Take 2 tablets (400 mg total) by mouth 2 (two) times daily.  Marland Kitchen albuterol (PROVENTIL HFA;VENTOLIN HFA) 108 (90 Base) MCG/ACT inhaler Inhale 2 puffs into the lungs every 6 (six) hours as needed for wheezing or shortness of breath.  Marland Kitchen albuterol (PROVENTIL) (2.5 MG/3ML) 0.083% nebulizer solution Take 3 mLs  (2.5 mg total) by nebulization every 6 (six) hours as needed for wheezing or shortness of breath. J75.909  . budesonide-formoterol (SYMBICORT) 160-4.5 MCG/ACT inhaler Inhale 2 puffs into the lungs 2 (two) times daily.  Marland Kitchen buPROPion (ZYBAN) 150 MG 12 hr tablet Take one tablet daily in the morning for one week.  Take one morning and night after one week. Continue for three months.  . montelukast (SINGULAIR) 10 MG tablet Take 1 tablet (10 mg total) by mouth at bedtime.   No facility-administered encounter medications on file as of 11/22/2018.    Physical Exam: Blood pressure 116/68, pulse 80, temperature (!) 97.2 F (36.2 C), temperature source Temporal, height 5\' 2"  (1.575 m), weight 146 lb 6.4 oz (66.4 kg), SpO2 99 %. Gen:      No acute distress HEENT:  EOMI, sclera anicteric Neck:     No masses; no thyromegaly Lungs:    Clear to auscultation bilaterally; normal respiratory effort CV:         Regular rate and rhythm; no murmurs Abd:      + bowel sounds; soft, non-tender; no palpable masses, no distension Ext:    No edema; adequate peripheral perfusion Skin:      Warm and dry; no rash Neuro: alert and oriented x 3 Psych: normal mood and affect  Data Reviewed: Imaging CXR 11/11/16- mild hyperinflation, mild interstitial prominence consistent with bronchitis CXR 05/19/17- no active cardiopulmonary disease. Clear lungs. I have reviewed the images personally.  PFTs 09/08/17 FVC 4.10 (  103%], FEV1 3.54 [104%],F/F 86, DLCO 133% Elevated diffusion capacity, no obstruction. Unable to perform lung volumes.  FENO 05/19/17- 25 FENO 08/23/18- 46  ACT score 11/20/2020- 14  Labs CBC with differential 6/12/1 8-WBC 7.3, eosinophil 17%, absolute eosinophil count 1200 Blood allergy profile 05/19/17-sensitive to multiple allergens, IgE 2018 ANA, CCP, rheumatoid factor, ANCA 08/23/18-negative Parasite serologies 08/23/18-positive for toxocara.  Negative for schistosoma, strongyloides, trichnella Mold  antibody panel 08/23/18-negative B12 08/23/18-1180  Assessment:  Severe persistent asthma,  Continues on Symbicort and Singulair.  He is noncompliant to medications a few years ago but appears to be regular now Since he continues to have severe persistent symptoms with ACT score of 14 we will start him on Nucala injections.  Elevated IgE, Eosinophils His labs show marked elevation in peripheral eosinophils and IgE levels. This could be from atopic allergy but given his frequent travel to [place] he was evalauted for parasitic infection.  He has been treated for possible parasitic infection given positive antibodies for Toxocara in November 2019  Health maintenance Got 2 shots of Pfizer vaccine.  Advised him to get the Covid booster Flu vaccine today.  Plan/Recommendations: - Continue Symbicort, albuterol rescue medication - Singulair - Repeat CBC with diff, IgE levels - Start paperwork for nucala  [name] MD [facility] 08/23/2018, 4:44 PM

## 2020-11-20 NOTE — Patient Instructions (Signed)
Check CBC differential and IgE Continue Symbicort, singular Start paperwork for nucala  Follow-up in 3 months.

## 2020-11-20 NOTE — Addendum Note (Signed)
Addended by: Demetrio Lapping E on: 11/20/2020 09:06 AM   Modules accepted: Orders

## 2020-11-21 LAB — IGE: IgE (Immunoglobulin E), Serum: 864 kU/L — ABNORMAL HIGH (ref <=114)

## 2020-12-06 ENCOUNTER — Other Ambulatory Visit: Payer: Self-pay | Admitting: Pulmonary Disease

## 2020-12-06 DIAGNOSIS — J4540 Moderate persistent asthma, uncomplicated: Secondary | ICD-10-CM

## 2021-02-15 ENCOUNTER — Telehealth: Payer: Self-pay | Admitting: Pulmonary Disease

## 2021-02-15 NOTE — Telephone Encounter (Signed)
ATC brother in law. Patient had 11 refills on 11/2020 should have plenty and the pharmacy  Mail box full , unable to leave message

## 2021-02-19 ENCOUNTER — Ambulatory Visit (INDEPENDENT_AMBULATORY_CARE_PROVIDER_SITE_OTHER): Payer: BC Managed Care – PPO | Admitting: Pulmonary Disease

## 2021-02-19 ENCOUNTER — Encounter: Payer: Self-pay | Admitting: Pulmonary Disease

## 2021-02-19 ENCOUNTER — Other Ambulatory Visit: Payer: Self-pay

## 2021-02-19 VITALS — BP 116/68 | HR 100 | Temp 98.3°F | Ht 62.0 in | Wt 145.6 lb

## 2021-02-19 DIAGNOSIS — J4550 Severe persistent asthma, uncomplicated: Secondary | ICD-10-CM

## 2021-02-19 NOTE — Telephone Encounter (Signed)
ATC brother in law. VM box is full. WCB.

## 2021-02-19 NOTE — Progress Notes (Signed)
Timothy Adkins    [id number]    04/25/1986  Primary Care Physician:Patient, No Pcp Per  Referring Physician: No referring provider defined for this encounter.  Problem list:  T2 high asthma Severe persistent asthma  HPI: [name] is a 35 year old with history of asthma. He was diagnosed in 2016 treated with qvar, advair. He is currently on Symbicort but is taking it as needed. He has an albuterol rescue inhaler on his med list but tells me that he does not have it at home. His chief complaint is really symptoms of dyspnea, cough with no mucus, wheezing. He does not have nighttime awakenings, denies fevers, chills, hemoptysis.  He was born in [place] and has been in [place] for the past 11 years. He travels back to [place] every couple of years. He has occasional seasonal allergies, no acid reflux. No known exposures. 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  Work-up for parasite infection in setting of high IgE, eosinophil showed positive antibodies to Toxocara He finished a course of albendazole in November 2019  Pets: No pets, birds.  Occupation:Works as a Advertising account planner. Exposures:  No known exposures at work. No mold, hot tubs at home Smoking history:1/2 ppd for 10 year.   Interim history: Continues on Symbicort.  Tells me that he is using it regularly 2 puffs twice daily He was supposed to get started on Nucala but did not answer his telephone and hence did not get started.  Overall states that breathing is better  Continues to smoke a pack of cigarettes every week  Outpatient Encounter Medications as of 11/22/2018  Medication Sig  . albendazole (ALBENZA) 200 MG tablet Take 2 tablets (400 mg total) by mouth 2 (two) times daily.  Marland Kitchen albuterol (PROVENTIL HFA;VENTOLIN HFA) 108 (90 Base) MCG/ACT inhaler Inhale 2 puffs into the lungs every 6 (six) hours as needed for wheezing or shortness  of breath.  Marland Kitchen albuterol (PROVENTIL) (2.5 MG/3ML) 0.083% nebulizer solution Take 3 mLs (2.5 mg total) by nebulization every 6 (six) hours as needed for wheezing or shortness of breath. [id number]  . budesonide-formoterol (SYMBICORT) 160-4.5 MCG/ACT inhaler Inhale 2 puffs into the lungs 2 (two) times daily.  Marland Kitchen buPROPion (ZYBAN) 150 MG 12 hr tablet Take one tablet daily in the morning for one week.  Take one morning and night after one week. Continue for three months.  . montelukast (SINGULAIR) 10 MG tablet Take 1 tablet (10 mg total) by mouth at bedtime.   No facility-administered encounter medications on file as of 11/22/2018.    Physical Exam: Blood pressure 116/68, pulse 100, temperature 98.3 F (36.8 C), temperature source Temporal, height 5\' 2"  (1.575 m), weight 145 lb 9.6 oz (66 kg), SpO2 97 %. Gen:      No acute distress HEENT:  EOMI, sclera anicteric Neck:     No masses; no thyromegaly Lungs:    Clear to auscultation bilaterally; normal respiratory effort CV:         Regular rate and rhythm; no murmurs Abd:      + bowel sounds; soft, non-tender; no palpable masses, no distension Ext:    No edema; adequate peripheral perfusion Skin:      Warm and dry; no rash Neuro: alert and oriented x 3 Psych: normal mood and affect  Data Reviewed: Imaging CXR 11/11/16- mild hyperinflation, mild interstitial prominence consistent with bronchitis CXR  05/19/17- no active cardiopulmonary disease. Clear lungs. I have reviewed the images personally.  PFTs 09/08/17 FVC 4.10 (103%], FEV1 3.54 [104%],F/F 86, DLCO 133% Elevated diffusion capacity, no obstruction. Unable to perform lung volumes.  FENO 05/19/17- 25 FENO 08/23/18- 46  ACT score 11/20/2020- 14 ACT score 02/19/22- 23  Labs CBC with differential 6/12/1 8-WBC 7.3, eosinophil 17%, absolute eosinophil count 1200 Blood allergy profile 05/19/17-sensitive to multiple allergens, IgE 2018 ANA, CCP, rheumatoid factor, ANCA 08/23/18-negative Parasite  serologies 08/23/18-positive for toxocara.  Negative for schistosoma, strongyloides, trichnella Mold antibody panel 08/23/18-negative B12 08/23/18-1180  Assessment:  Severe persistent asthma,  Continues on Symbicort and Singulair.  He is noncompliant to medications a few years ago but appears to be regular now We had considered Nucala injections but he seems to be doing better with ACT score of 23.  We will hold off Biologics for now and continue monitoring  Advised him to quit smoking  Elevated IgE, Eosinophils His labs show marked elevation in peripheral eosinophils and IgE levels. This could be from atopic allergy but given his frequent travel to [place] he was evalauted for parasitic infection.  He has been treated for possible parasitic infection given positive antibodies for Toxocara in November 2019  Health maintenance Up-to-date with Covid  Plan/Recommendations: - Continue Symbicort, albuterol rescue medication - Singulair - Quit smoking  [name] MD [facility] 08/23/2018, 4:44 PM

## 2021-02-19 NOTE — Patient Instructions (Signed)
Continue to work on quitting smoking Continue Symbicort inhaler.  He can take extra puffs as needed when he has increased shortness of breath  Follow-up in 6 months

## 2021-02-20 NOTE — Telephone Encounter (Signed)
Closing per protocol
